# Patient Record
Sex: Male | Born: 1945
Health system: Southern US, Community
[De-identification: ages and names within clinical notes are randomized; demographics above are authoritative.]

## PROBLEM LIST (undated history)

## (undated) DIAGNOSIS — E119 Type 2 diabetes mellitus without complications: Secondary | ICD-10-CM

## (undated) DIAGNOSIS — F4310 Post-traumatic stress disorder, unspecified: Secondary | ICD-10-CM

## (undated) DIAGNOSIS — F0390 Unspecified dementia without behavioral disturbance: Secondary | ICD-10-CM

## (undated) DIAGNOSIS — R42 Dizziness and giddiness: Secondary | ICD-10-CM

## (undated) DIAGNOSIS — T1490XA Injury, unspecified, initial encounter: Secondary | ICD-10-CM

## (undated) DIAGNOSIS — I10 Essential (primary) hypertension: Secondary | ICD-10-CM

## (undated) DIAGNOSIS — M359 Systemic involvement of connective tissue, unspecified: Secondary | ICD-10-CM

## (undated) DIAGNOSIS — I2510 Atherosclerotic heart disease of native coronary artery without angina pectoris: Secondary | ICD-10-CM

## (undated) DIAGNOSIS — I509 Heart failure, unspecified: Secondary | ICD-10-CM

## (undated) DIAGNOSIS — R55 Syncope and collapse: Secondary | ICD-10-CM

## (undated) DIAGNOSIS — J45909 Unspecified asthma, uncomplicated: Secondary | ICD-10-CM

## (undated) DIAGNOSIS — G459 Transient cerebral ischemic attack, unspecified: Secondary | ICD-10-CM

## (undated) HISTORY — PX: CARDIAC SURGERY: SHX584

## (undated) HISTORY — PX: TOTAL SHOULDER REPLACEMENT: SUR1217

## (undated) HISTORY — DX: Transient cerebral ischemic attack, unspecified: G45.9

## (undated) HISTORY — DX: Injury, unspecified, initial encounter: T14.90XA

---

## 2000-11-04 ENCOUNTER — Inpatient Hospital Stay (HOSPITAL_COMMUNITY): Admission: AD | Admit: 2000-11-04 | Discharge: 2000-11-05 | Payer: Self-pay | Admitting: *Deleted

## 2002-10-04 ENCOUNTER — Inpatient Hospital Stay (HOSPITAL_COMMUNITY): Admission: EM | Admit: 2002-10-04 | Discharge: 2002-10-13 | Payer: Self-pay | Admitting: Cardiology

## 2002-10-06 ENCOUNTER — Encounter: Payer: Self-pay | Admitting: Cardiothoracic Surgery

## 2002-10-07 ENCOUNTER — Encounter: Payer: Self-pay | Admitting: Cardiothoracic Surgery

## 2002-10-08 ENCOUNTER — Encounter: Payer: Self-pay | Admitting: Cardiothoracic Surgery

## 2002-10-09 ENCOUNTER — Encounter: Payer: Self-pay | Admitting: Cardiothoracic Surgery

## 2002-10-10 ENCOUNTER — Encounter: Payer: Self-pay | Admitting: Cardiothoracic Surgery

## 2002-11-05 ENCOUNTER — Encounter: Admission: RE | Admit: 2002-11-05 | Discharge: 2002-11-05 | Payer: Self-pay | Admitting: Cardiothoracic Surgery

## 2002-11-05 ENCOUNTER — Encounter: Payer: Self-pay | Admitting: Cardiothoracic Surgery

## 2003-03-05 HISTORY — PX: EXPLORATION POST OPERATIVE OPEN HEART: SHX5061

## 2014-07-22 ENCOUNTER — Encounter (HOSPITAL_COMMUNITY): Payer: Self-pay | Admitting: Family Medicine

## 2014-07-22 ENCOUNTER — Emergency Department (HOSPITAL_COMMUNITY): Payer: Medicare HMO

## 2014-07-22 ENCOUNTER — Inpatient Hospital Stay (HOSPITAL_COMMUNITY)
Admission: EM | Admit: 2014-07-22 | Discharge: 2014-07-27 | DRG: 084 | Disposition: A | Discharge status: Skilled Nursing Facility | Payer: Medicare HMO | Attending: Internal Medicine | Admitting: Internal Medicine

## 2014-07-22 DIAGNOSIS — G319 Degenerative disease of nervous system, unspecified: Secondary | ICD-10-CM | POA: Diagnosis present

## 2014-07-22 DIAGNOSIS — I959 Hypotension, unspecified: Secondary | ICD-10-CM | POA: Diagnosis not present

## 2014-07-22 DIAGNOSIS — Z1881 Retained glass fragments: Secondary | ICD-10-CM

## 2014-07-22 DIAGNOSIS — S065X9A Traumatic subdural hemorrhage with loss of consciousness of unspecified duration, initial encounter: Secondary | ICD-10-CM | POA: Diagnosis present

## 2014-07-22 DIAGNOSIS — E876 Hypokalemia: Secondary | ICD-10-CM | POA: Diagnosis not present

## 2014-07-22 DIAGNOSIS — I25119 Atherosclerotic heart disease of native coronary artery with unspecified angina pectoris: Secondary | ICD-10-CM | POA: Diagnosis not present

## 2014-07-22 DIAGNOSIS — S80212A Abrasion, left knee, initial encounter: Secondary | ICD-10-CM | POA: Diagnosis not present

## 2014-07-22 DIAGNOSIS — I252 Old myocardial infarction: Secondary | ICD-10-CM | POA: Diagnosis not present

## 2014-07-22 DIAGNOSIS — I10 Essential (primary) hypertension: Secondary | ICD-10-CM | POA: Diagnosis not present

## 2014-07-22 DIAGNOSIS — T1490 Injury, unspecified: Secondary | ICD-10-CM | POA: Diagnosis present

## 2014-07-22 DIAGNOSIS — S0031XA Abrasion of nose, initial encounter: Secondary | ICD-10-CM | POA: Diagnosis present

## 2014-07-22 DIAGNOSIS — I493 Ventricular premature depolarization: Secondary | ICD-10-CM | POA: Diagnosis not present

## 2014-07-22 DIAGNOSIS — S40811A Abrasion of right upper arm, initial encounter: Secondary | ICD-10-CM | POA: Diagnosis present

## 2014-07-22 DIAGNOSIS — E785 Hyperlipidemia, unspecified: Secondary | ICD-10-CM | POA: Diagnosis present

## 2014-07-22 DIAGNOSIS — Z79891 Long term (current) use of opiate analgesic: Secondary | ICD-10-CM | POA: Diagnosis not present

## 2014-07-22 DIAGNOSIS — Z794 Long term (current) use of insulin: Secondary | ICD-10-CM | POA: Diagnosis not present

## 2014-07-22 DIAGNOSIS — F4310 Post-traumatic stress disorder, unspecified: Secondary | ICD-10-CM | POA: Diagnosis present

## 2014-07-22 DIAGNOSIS — Z79899 Other long term (current) drug therapy: Secondary | ICD-10-CM | POA: Diagnosis not present

## 2014-07-22 DIAGNOSIS — E780 Pure hypercholesterolemia: Secondary | ICD-10-CM | POA: Diagnosis present

## 2014-07-22 DIAGNOSIS — S065XAA Traumatic subdural hemorrhage with loss of consciousness status unknown, initial encounter: Secondary | ICD-10-CM

## 2014-07-22 DIAGNOSIS — I509 Heart failure, unspecified: Secondary | ICD-10-CM | POA: Diagnosis present

## 2014-07-22 DIAGNOSIS — Z96612 Presence of left artificial shoulder joint: Secondary | ICD-10-CM | POA: Diagnosis present

## 2014-07-22 DIAGNOSIS — S20319A Abrasion of unspecified front wall of thorax, initial encounter: Secondary | ICD-10-CM | POA: Diagnosis not present

## 2014-07-22 DIAGNOSIS — Z951 Presence of aortocoronary bypass graft: Secondary | ICD-10-CM

## 2014-07-22 DIAGNOSIS — E1165 Type 2 diabetes mellitus with hyperglycemia: Secondary | ICD-10-CM | POA: Diagnosis not present

## 2014-07-22 DIAGNOSIS — Z23 Encounter for immunization: Secondary | ICD-10-CM | POA: Diagnosis not present

## 2014-07-22 DIAGNOSIS — F0390 Unspecified dementia without behavioral disturbance: Secondary | ICD-10-CM | POA: Diagnosis present

## 2014-07-22 DIAGNOSIS — I6200 Nontraumatic subdural hemorrhage, unspecified: Secondary | ICD-10-CM | POA: Diagnosis not present

## 2014-07-22 DIAGNOSIS — T1490XA Injury, unspecified, initial encounter: Secondary | ICD-10-CM

## 2014-07-22 DIAGNOSIS — S0001XA Abrasion of scalp, initial encounter: Secondary | ICD-10-CM | POA: Diagnosis present

## 2014-07-22 DIAGNOSIS — S40812A Abrasion of left upper arm, initial encounter: Secondary | ICD-10-CM | POA: Diagnosis present

## 2014-07-22 DIAGNOSIS — J45909 Unspecified asthma, uncomplicated: Secondary | ICD-10-CM | POA: Diagnosis present

## 2014-07-22 HISTORY — DX: Atherosclerotic heart disease of native coronary artery without angina pectoris: I25.10

## 2014-07-22 HISTORY — DX: Unspecified asthma, uncomplicated: J45.909

## 2014-07-22 HISTORY — DX: Essential (primary) hypertension: I10

## 2014-07-22 HISTORY — DX: Traumatic subdural hemorrhage with loss of consciousness status unknown, initial encounter: S06.5XAA

## 2014-07-22 HISTORY — DX: Unspecified dementia, unspecified severity, without behavioral disturbance, psychotic disturbance, mood disturbance, and anxiety: F03.90

## 2014-07-22 HISTORY — DX: Heart failure, unspecified: I50.9

## 2014-07-22 HISTORY — DX: Systemic involvement of connective tissue, unspecified: M35.9

## 2014-07-22 HISTORY — DX: Syncope and collapse: R55

## 2014-07-22 HISTORY — DX: Post-traumatic stress disorder, unspecified: F43.10

## 2014-07-22 HISTORY — DX: Type 2 diabetes mellitus without complications: E11.9

## 2014-07-22 HISTORY — DX: Dizziness and giddiness: R42

## 2014-07-22 LAB — URINALYSIS, ROUTINE W REFLEX MICROSCOPIC
Glucose, UA: 500 mg/dL — AB
Ketones, ur: >80 mg/dL — AB
LEUKOCYTES UA: NEGATIVE
NITRITE: NEGATIVE
PROTEIN: NEGATIVE mg/dL
Specific Gravity, Urine: 1.02 (ref 1.005–1.030)
UROBILINOGEN UA: 0.2 mg/dL (ref 0.0–1.0)
pH: 5.5 (ref 5.0–8.0)

## 2014-07-22 LAB — COMPREHENSIVE METABOLIC PANEL
ALT: 21 U/L (ref 17–63)
AST: 33 U/L (ref 15–41)
Albumin: 4.3 g/dL (ref 3.5–5.0)
Alkaline Phosphatase: 79 U/L (ref 38–126)
Anion gap: 15 (ref 5–15)
BILIRUBIN TOTAL: 1.3 mg/dL — AB (ref 0.3–1.2)
BUN: 19 mg/dL (ref 6–20)
CALCIUM: 9.4 mg/dL (ref 8.9–10.3)
CO2: 20 mmol/L — AB (ref 22–32)
CREATININE: 1.16 mg/dL (ref 0.61–1.24)
Chloride: 103 mmol/L (ref 101–111)
GFR calc Af Amer: >60 mL/min (ref >60)
GLUCOSE: 285 mg/dL — AB (ref 65–99)
Potassium: 4.3 mmol/L (ref 3.5–5.1)
Sodium: 138 mmol/L (ref 135–145)
Total Protein: 7.8 g/dL (ref 6.5–8.1)

## 2014-07-22 LAB — URINE MICROSCOPIC-ADD ON

## 2014-07-22 LAB — I-STAT TROPONIN, ED: Troponin i, poc: 0.02 ng/mL (ref 0.00–0.08)

## 2014-07-22 LAB — VITAMIN B12: VITAMIN B 12: 344 pg/mL (ref 180–914)

## 2014-07-22 LAB — I-STAT CG4 LACTIC ACID, ED: Lactic Acid, Venous: 1.41 mmol/L (ref 0.5–2.0)

## 2014-07-22 LAB — CBG MONITORING, ED: GLUCOSE-CAPILLARY: 233 mg/dL — AB (ref 65–99)

## 2014-07-22 LAB — PROTIME-INR
INR: 1.14 (ref 0.00–1.49)
Prothrombin Time: 14.8 seconds (ref 11.6–15.2)

## 2014-07-22 LAB — CBC
HEMATOCRIT: 45.8 % (ref 39.0–52.0)
Hemoglobin: 16.2 g/dL (ref 13.0–17.0)
MCH: 33.2 pg (ref 26.0–34.0)
MCHC: 35.4 g/dL (ref 30.0–36.0)
MCV: 93.9 fL (ref 78.0–100.0)
PLATELETS: 316 10*3/uL (ref 150–400)
RBC: 4.88 MIL/uL (ref 4.22–5.81)
RDW: 12.7 % (ref 11.5–15.5)
WBC: 14.1 10*3/uL — ABNORMAL HIGH (ref 4.0–10.5)

## 2014-07-22 LAB — FOLATE: Folate: 28 ng/mL (ref >5.9)

## 2014-07-22 LAB — TROPONIN I: Troponin I: 0.03 ng/mL (ref <0.031)

## 2014-07-22 LAB — ETHANOL: Alcohol, Ethyl (B): <5 mg/dL (ref <5)

## 2014-07-22 MED ORDER — BENAZEPRIL HCL 40 MG PO TABS
40.0000 mg | ORAL_TABLET | Freq: Every day | ORAL | Status: DC
  Administered 2014-07-23: 40 mg via ORAL
  Filled 2014-07-22 (×2): qty 1

## 2014-07-22 MED ORDER — TERAZOSIN HCL 2 MG PO CAPS
2.0000 mg | ORAL_CAPSULE | Freq: Every day | ORAL | Status: DC
  Administered 2014-07-23 – 2014-07-26 (×4): 2 mg via ORAL
  Filled 2014-07-22 (×7): qty 1

## 2014-07-22 MED ORDER — METOPROLOL TARTRATE 25 MG PO TABS
25.0000 mg | ORAL_TABLET | Freq: Two times a day (BID) | ORAL | Status: DC
  Administered 2014-07-23 – 2014-07-24 (×3): 25 mg via ORAL
  Filled 2014-07-22 (×5): qty 1

## 2014-07-22 MED ORDER — FAMOTIDINE IN NACL 20-0.9 MG/50ML-% IV SOLN
20.0000 mg | Freq: Two times a day (BID) | INTRAVENOUS | Status: DC
  Administered 2014-07-23 – 2014-07-24 (×4): 20 mg via INTRAVENOUS
  Filled 2014-07-22 (×5): qty 50

## 2014-07-22 MED ORDER — ACETAMINOPHEN 325 MG PO TABS
650.0000 mg | ORAL_TABLET | ORAL | Status: DC | PRN
  Administered 2014-07-23 – 2014-07-24 (×3): 650 mg via ORAL
  Filled 2014-07-22 (×3): qty 2

## 2014-07-22 MED ORDER — FINASTERIDE 5 MG PO TABS
5.0000 mg | ORAL_TABLET | Freq: Every day | ORAL | Status: DC
  Administered 2014-07-23 – 2014-07-27 (×6): 5 mg via ORAL
  Filled 2014-07-22 (×6): qty 1

## 2014-07-22 MED ORDER — LIDOCAINE HCL (PF) 1 % IJ SOLN
INTRAMUSCULAR | Status: AC
  Filled 2014-07-22: qty 5

## 2014-07-22 MED ORDER — CARVEDILOL 6.25 MG PO TABS: 6.2500 mg | ORAL_TABLET | Freq: Two times a day (BID) | ORAL | Status: DC

## 2014-07-22 MED ORDER — PROMETHAZINE HCL 25 MG PO TABS: 25.0000 mg | ORAL_TABLET | Freq: Once | ORAL | Status: DC

## 2014-07-22 MED ORDER — ONDANSETRON HCL 4 MG/2ML IJ SOLN
4.0000 mg | Freq: Four times a day (QID) | INTRAMUSCULAR | Status: DC | PRN
  Administered 2014-07-25: 4 mg via INTRAVENOUS
  Filled 2014-07-22: qty 2

## 2014-07-22 MED ORDER — IOHEXOL 300 MG/ML  SOLN
100.0000 mL | Freq: Once | INTRAMUSCULAR | Status: AC | PRN
  Administered 2014-07-22: 100 mL via INTRAVENOUS

## 2014-07-22 MED ORDER — INSULIN ASPART 100 UNIT/ML ~~LOC~~ SOLN
2.0000 [IU] | SUBCUTANEOUS | Status: DC
  Administered 2014-07-23: 6 [IU] via SUBCUTANEOUS
  Administered 2014-07-23: 4 [IU] via SUBCUTANEOUS
  Administered 2014-07-23: 6 [IU] via SUBCUTANEOUS
  Administered 2014-07-23: 2 [IU] via SUBCUTANEOUS

## 2014-07-22 MED ORDER — ONDANSETRON HCL 4 MG/2ML IJ SOLN
4.0000 mg | Freq: Once | INTRAMUSCULAR | Status: AC
  Administered 2014-07-22: 4 mg via INTRAVENOUS
  Filled 2014-07-22: qty 2

## 2014-07-22 MED ORDER — ATORVASTATIN CALCIUM 40 MG PO TABS
40.0000 mg | ORAL_TABLET | Freq: Every day | ORAL | Status: DC
  Administered 2014-07-23 – 2014-07-26 (×4): 40 mg via ORAL
  Filled 2014-07-22 (×5): qty 1

## 2014-07-22 MED ORDER — ATORVASTATIN CALCIUM 80 MG PO TABS: 80.0000 mg | ORAL_TABLET | Freq: Every day | ORAL | Status: DC

## 2014-07-22 MED ORDER — PROMETHAZINE HCL 25 MG/ML IJ SOLN
12.5000 mg | Freq: Once | INTRAMUSCULAR | Status: AC
  Administered 2014-07-22: 12.5 mg via INTRAVENOUS
  Filled 2014-07-22: qty 1

## 2014-07-22 MED ORDER — ALBUTEROL SULFATE (2.5 MG/3ML) 0.083% IN NEBU: 2.5000 mg | INHALATION_SOLUTION | RESPIRATORY_TRACT | Status: DC | PRN

## 2014-07-22 MED ORDER — SODIUM CHLORIDE 0.9 % IV SOLN: 250.0000 mL | INTRAVENOUS | Status: DC | PRN

## 2014-07-22 MED ORDER — TETANUS-DIPHTH-ACELL PERTUSSIS 5-2.5-18.5 LF-MCG/0.5 IM SUSP
0.5000 mL | Freq: Once | INTRAMUSCULAR | Status: AC
  Administered 2014-07-22: 0.5 mL via INTRAMUSCULAR
  Filled 2014-07-22: qty 0.5
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
## 2014-07-22 NOTE — ED Notes (Signed)
Paged Dr. Jordan LikesPool to [phone]

## 2014-07-22 NOTE — ED Notes (Signed)
Pt reports wiping himself down with bleach today.
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
## 2014-07-22 NOTE — ED Notes (Signed)
Critical care MD at bedside 
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
## 2014-07-22 NOTE — ED Notes (Signed)
Ok to remove c-collar and sit pt up in bed per Dr Jeraldine LootsLockwood.

## 2014-07-22 NOTE — ED Notes (Addendum)
Harwani, MD at bedside. 
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
## 2014-07-22 NOTE — ED Notes (Signed)
Douglas Flores(son): [phone].

## 2014-07-23 ENCOUNTER — Inpatient Hospital Stay (HOSPITAL_COMMUNITY): Payer: Medicare HMO

## 2014-07-23 DIAGNOSIS — T1490XA Injury, unspecified, initial encounter: Secondary | ICD-10-CM | POA: Insufficient documentation

## 2014-07-23 DIAGNOSIS — I6200 Nontraumatic subdural hemorrhage, unspecified: Secondary | ICD-10-CM

## 2014-07-23 DIAGNOSIS — T1490 Injury, unspecified: Secondary | ICD-10-CM

## 2014-07-23 LAB — RPR: RPR: NONREACTIVE

## 2014-07-23 LAB — CBC
HCT: 40.8 % (ref 39.0–52.0)
Hemoglobin: 14 g/dL (ref 13.0–17.0)
MCH: 32.6 pg (ref 26.0–34.0)
MCHC: 34.3 g/dL (ref 30.0–36.0)
MCV: 94.9 fL (ref 78.0–100.0)
Platelets: 289 10*3/uL (ref 150–400)
RBC: 4.3 MIL/uL (ref 4.22–5.81)
RDW: 12.9 % (ref 11.5–15.5)
WBC: 13.2 10*3/uL — ABNORMAL HIGH (ref 4.0–10.5)

## 2014-07-23 LAB — LIPID PANEL
Cholesterol: 194 mg/dL (ref 0–200)
HDL: 36 mg/dL — AB (ref >40)
LDL CALC: 123 mg/dL — AB (ref 0–99)
Total CHOL/HDL Ratio: 5.4 RATIO
Triglycerides: 177 mg/dL — ABNORMAL HIGH (ref <150)
VLDL: 35 mg/dL (ref 0–40)

## 2014-07-23 LAB — MAGNESIUM: MAGNESIUM: 1.8 mg/dL (ref 1.7–2.4)

## 2014-07-23 LAB — GLUCOSE, CAPILLARY
GLUCOSE-CAPILLARY: 175 mg/dL — AB (ref 65–99)
GLUCOSE-CAPILLARY: 202 mg/dL — AB (ref 65–99)
GLUCOSE-CAPILLARY: 266 mg/dL — AB (ref 65–99)
GLUCOSE-CAPILLARY: 291 mg/dL — AB (ref 65–99)
GLUCOSE-CAPILLARY: 296 mg/dL — AB (ref 65–99)
Glucose-Capillary: 141 mg/dL — ABNORMAL HIGH (ref 65–99)
Glucose-Capillary: 285 mg/dL — ABNORMAL HIGH (ref 65–99)

## 2014-07-23 LAB — BASIC METABOLIC PANEL
Anion gap: 11 (ref 5–15)
BUN: 28 mg/dL — ABNORMAL HIGH (ref 6–20)
CHLORIDE: 105 mmol/L (ref 101–111)
CO2: 24 mmol/L (ref 22–32)
Calcium: 8.6 mg/dL — ABNORMAL LOW (ref 8.9–10.3)
Creatinine, Ser: 1.76 mg/dL — ABNORMAL HIGH (ref 0.61–1.24)
GFR calc non Af Amer: 38 mL/min — ABNORMAL LOW (ref >60)
GFR, EST AFRICAN AMERICAN: 44 mL/min — AB (ref >60)
Glucose, Bld: 223 mg/dL — ABNORMAL HIGH (ref 65–99)
POTASSIUM: 4.2 mmol/L (ref 3.5–5.1)
Sodium: 140 mmol/L (ref 135–145)

## 2014-07-23 LAB — TROPONIN I
TROPONIN I: 0.05 ng/mL — AB (ref <0.031)
Troponin I: 0.05 ng/mL — ABNORMAL HIGH (ref <0.031)
Troponin I: 0.04 ng/mL — ABNORMAL HIGH (ref <0.031)

## 2014-07-23 LAB — PHOSPHORUS: Phosphorus: 4.8 mg/dL — ABNORMAL HIGH (ref 2.5–4.6)

## 2014-07-23 LAB — HIV ANTIBODY (ROUTINE TESTING W REFLEX): HIV Screen 4th Generation wRfx: NONREACTIVE

## 2014-07-23 LAB — TSH: TSH: 0.861 u[IU]/mL (ref 0.350–4.500)

## 2014-07-23 LAB — MRSA PCR SCREENING: MRSA BY PCR: NEGATIVE

## 2014-07-23 LAB — CLOSTRIDIUM DIFFICILE BY PCR: Toxigenic C. Difficile by PCR: NEGATIVE

## 2014-07-23 MED ORDER — SODIUM CHLORIDE 0.9 % IV SOLN
INTRAVENOUS | Status: DC
  Administered 2014-07-23: 2.9 [IU]/h via INTRAVENOUS
  Filled 2014-07-23: qty 2.5

## 2014-07-23 MED ORDER — INSULIN GLARGINE 100 UNIT/ML ~~LOC~~ SOLN
10.0000 [IU] | Freq: Two times a day (BID) | SUBCUTANEOUS | Status: DC
  Administered 2014-07-23: 10 [IU] via SUBCUTANEOUS
  Filled 2014-07-23 (×2): qty 0.1

## 2014-07-23 MED ORDER — SODIUM CHLORIDE 0.9 % IV BOLUS (SEPSIS)
1000.0000 mL | Freq: Once | INTRAVENOUS | Status: AC
  Administered 2014-07-23: 1000 mL via INTRAVENOUS

## 2014-07-23 MED ORDER — PERFLUTREN LIPID MICROSPHERE
1.0000 mL | INTRAVENOUS | Status: AC | PRN
  Administered 2014-07-23: 6 mL via INTRAVENOUS
  Filled 2014-07-23: qty 10

## 2014-07-23 MED ORDER — CETYLPYRIDINIUM CHLORIDE 0.05 % MT LIQD
7.0000 mL | Freq: Two times a day (BID) | OROMUCOSAL | Status: DC
  Administered 2014-07-23 – 2014-07-24 (×3): 7 mL via OROMUCOSAL

## 2014-07-23 MED ORDER — MAGNESIUM SULFATE 50 % IJ SOLN: 2.0000 g | Freq: Once | INTRAVENOUS | Status: DC

## 2014-07-23 MED ORDER — MAGNESIUM SULFATE 2 GM/50ML IV SOLN
2.0000 g | Freq: Once | INTRAVENOUS | Status: AC
  Administered 2014-07-23: 2 g via INTRAVENOUS
  Filled 2014-07-23: qty 50

## 2014-07-23 MED ORDER — CHLORHEXIDINE GLUCONATE 0.12 % MT SOLN
15.0000 mL | Freq: Two times a day (BID) | OROMUCOSAL | Status: DC
  Administered 2014-07-23 – 2014-07-24 (×4): 15 mL via OROMUCOSAL
  Filled 2014-07-23 (×4): qty 15

## 2014-07-23 NOTE — Progress Notes (Addendum)
eLink Physician-Brief Progress Note Patient Name: Adaline SillLarry Tetro DOB: 05/10/1945 MRN: [id number]   Date of Service  07/23/2014  HPI/Events of Note  Hypotension. BP = 77/51. Patient received Metoprolol, Hytrin and Benazepril all at the same time.   eICU Interventions  Will cautiously fluid challenge. Nurse instructed not to give all three medications at the same time again.      Intervention Category Intermediate Interventions: Hypotension - evaluation and management  Cameron Schwinn Eugene 07/23/2014, 2:14 AM
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
## 2014-07-23 NOTE — Progress Notes (Signed)
  Echocardiogram 2D Echocardiogram has been performed.  Janalyn HarderWest, Karas Pickerill R 07/23/2014, 10:28 AM
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
## 2014-07-23 NOTE — Progress Notes (Signed)
eLink Physician-Brief Progress Note Patient Name: Douglas SillLarry Doell DOB: 06-05-1945 MRN: [id number]   Date of Service  07/23/2014  HPI/Events of Note  Hypotension with BP = 74/46.  eICU Interventions  Will bolus with 0.9 NaCl 1 liter IV over 1 hour now.      Intervention Category Intermediate Interventions: Hypotension - evaluation and management  Sommer,Steven Eugene 07/23/2014, 6:08 AM

## 2014-07-23 NOTE — Progress Notes (Signed)
Pressure drsg to right wrist secured with elastic band. Drsg removed 2nd to edematous hand. Puncture site noted with stitch in place. No active bleeding noted, site redressed with gauze and paper tape.  Radial pulse easily palpable.
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
## 2014-07-23 NOTE — Progress Notes (Signed)
Called Neuro Sx due to pt's confusion.   MD stated to continue to monitor pt, and no additional orders were given. I will continue to monitor pt.
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
## 2014-07-23 NOTE — Progress Notes (Signed)
eLink Physician-Brief Progress Note Patient Name: Douglas Flores DOB: [birth year] MRN: [id number]   Date of Service  07/23/2014  HPI/Events of Note  Elevated blood sugar.  Patient eating and on SSI only.  At home on 70/30 and metformin.  eICU Interventions  lantus 10u bid SQ and change SSI to sensitive     Intervention Category Intermediate Interventions: Hyperglycemia - evaluation and treatment  Henry RusselSMITH, [name], P 07/23/2014, 4:44 PM

## 2014-07-23 NOTE — Progress Notes (Signed)
Daily meds given as ordered. BP at this time 160/60. 2 hours later BP 70s/50s. Dr. Arsenio LoaderSommer notified. Bolus ordered and given. Meds not to be given together.
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
## 2014-07-24 LAB — GLUCOSE, CAPILLARY
GLUCOSE-CAPILLARY: 133 mg/dL — AB (ref 65–99)
GLUCOSE-CAPILLARY: 180 mg/dL — AB (ref 65–99)
GLUCOSE-CAPILLARY: 189 mg/dL — AB (ref 65–99)
GLUCOSE-CAPILLARY: 213 mg/dL — AB (ref 65–99)
GLUCOSE-CAPILLARY: 216 mg/dL — AB (ref 65–99)
GLUCOSE-CAPILLARY: 241 mg/dL — AB (ref 65–99)
GLUCOSE-CAPILLARY: 346 mg/dL — AB (ref 65–99)
GLUCOSE-CAPILLARY: 97 mg/dL (ref 65–99)
Glucose-Capillary: 106 mg/dL — ABNORMAL HIGH (ref 65–99)
Glucose-Capillary: 179 mg/dL — ABNORMAL HIGH (ref 65–99)
Glucose-Capillary: 275 mg/dL — ABNORMAL HIGH (ref 65–99)
Glucose-Capillary: 368 mg/dL — ABNORMAL HIGH (ref 65–99)
Glucose-Capillary: 85 mg/dL (ref 65–99)
Glucose-Capillary: 182 mg/dL — ABNORMAL HIGH (ref 65–99)

## 2014-07-24 LAB — BASIC METABOLIC PANEL
Anion gap: 7 (ref 5–15)
BUN: 10 mg/dL (ref 6–20)
CO2: 28 mmol/L (ref 22–32)
Calcium: 8.5 mg/dL — ABNORMAL LOW (ref 8.9–10.3)
Chloride: 107 mmol/L (ref 101–111)
Creatinine, Ser: 0.84 mg/dL (ref 0.61–1.24)
GFR calc Af Amer: >60 mL/min (ref >60)
GLUCOSE: 114 mg/dL — AB (ref 65–99)
POTASSIUM: 3.6 mmol/L (ref 3.5–5.1)
Sodium: 142 mmol/L (ref 135–145)

## 2014-07-24 LAB — MAGNESIUM: MAGNESIUM: 1.8 mg/dL (ref 1.7–2.4)

## 2014-07-24 LAB — PHOSPHORUS: Phosphorus: 2.7 mg/dL (ref 2.5–4.6)

## 2014-07-24 MED ORDER — POTASSIUM CHLORIDE 10 MEQ/100ML IV SOLN
10.0000 meq | INTRAVENOUS | Status: AC
  Administered 2014-07-24 (×2): 10 meq via INTRAVENOUS
  Filled 2014-07-24 (×2): qty 100

## 2014-07-24 MED ORDER — INSULIN ASPART 100 UNIT/ML ~~LOC~~ SOLN
0.0000 [IU] | Freq: Three times a day (TID) | SUBCUTANEOUS | Status: DC
  Administered 2014-07-24 (×2): 3 [IU] via SUBCUTANEOUS
  Administered 2014-07-24: 5 [IU] via SUBCUTANEOUS
  Administered 2014-07-25: 2 [IU] via SUBCUTANEOUS
  Administered 2014-07-25: 8 [IU] via SUBCUTANEOUS
  Administered 2014-07-25: 2 [IU] via SUBCUTANEOUS
  Administered 2014-07-26: 8 [IU] via SUBCUTANEOUS
  Administered 2014-07-26 (×2): 3 [IU] via SUBCUTANEOUS

## 2014-07-24 MED ORDER — INSULIN ASPART 100 UNIT/ML ~~LOC~~ SOLN
0.0000 [IU] | Freq: Every day | SUBCUTANEOUS | Status: DC
  Administered 2014-07-24: 2 [IU] via SUBCUTANEOUS

## 2014-07-24 MED ORDER — MAGNESIUM SULFATE 2 GM/50ML IV SOLN
2.0000 g | Freq: Once | INTRAVENOUS | Status: AC
  Administered 2014-07-24: 2 g via INTRAVENOUS
  Filled 2014-07-24: qty 50

## 2014-07-24 MED ORDER — INSULIN GLARGINE 100 UNIT/ML ~~LOC~~ SOLN
15.0000 [IU] | Freq: Two times a day (BID) | SUBCUTANEOUS | Status: DC
  Administered 2014-07-24 – 2014-07-27 (×7): 15 [IU] via SUBCUTANEOUS
  Filled 2014-07-24 (×8): qty 0.15

## 2014-07-24 MED ORDER — FAMOTIDINE 20 MG PO TABS
20.0000 mg | ORAL_TABLET | Freq: Two times a day (BID) | ORAL | Status: DC
  Administered 2014-07-24 – 2014-07-25 (×2): 20 mg via ORAL
  Filled 2014-07-24 (×3): qty 1

## 2014-07-24 MED ORDER — METOPROLOL TARTRATE 50 MG PO TABS
50.0000 mg | ORAL_TABLET | Freq: Two times a day (BID) | ORAL | Status: DC
  Administered 2014-07-24 – 2014-07-25 (×2): 50 mg via ORAL
  Filled 2014-07-24 (×3): qty 1

## 2014-07-24 MED ORDER — NITROGLYCERIN 0.4 MG SL SUBL
SUBLINGUAL_TABLET | SUBLINGUAL | Status: AC
  Administered 2014-07-24: 0.4 mg
  Filled 2014-07-24: qty 1

## 2014-07-24 MED ORDER — POTASSIUM CHLORIDE 10 MEQ/100ML IV SOLN: 10.0000 meq | INTRAVENOUS | Status: DC

## 2014-07-24 NOTE — Progress Notes (Signed)
eLink Physician-Brief Progress Note Patient Name: Adaline SillLarry Groleau DOB: 10/09/1945 MRN: [id number]   Date of Service  07/24/2014  HPI/Events of Note  Hyperglycemia, went back on insulin gtt despite glargine  eICU Interventions  Increase glargine bid dose D/c ICU hyperglycemia Add SSI     Intervention Category Intermediate Interventions: Hyperglycemia - evaluation and treatment  MCQUAID, DOUGLAS 07/24/2014, 4:24 AM

## 2014-07-24 NOTE — Progress Notes (Signed)
eLink Physician-Brief Progress Note Patient Name: Douglas SillLarry Flores DOB: 02/22/1946 MRN: [id number]   Date of Service  07/24/2014  HPI/Events of Note  K low, mg low, uop low  eICU Interventions  Replete K,Mg, low dose both     Intervention Category Intermediate Interventions: Electrolyte abnormality - evaluation and management  MCQUAID, DOUGLAS 07/24/2014, 6:21 AM
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
## 2014-07-24 NOTE — Progress Notes (Signed)
Overall stable. Patient denies headache or other problem beyond his sternal discomfort.  Afebrile. Blood pressure a little less labile. No periods arrhythmia or bradycardia. Awake and alert. Oriented and appropriate. Cranial nerve function intact. Chronic right and some [place]. Motor 5/5 bilaterally. No pronator drift.  Overall stable from my standpoint. Will repeat head CT scan tomorrow. I am hopeful that his subdural hematoma can be managed without surgical intervention.
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
## 2014-07-25 ENCOUNTER — Encounter (HOSPITAL_COMMUNITY): Payer: Self-pay | Admitting: Radiology

## 2014-07-25 ENCOUNTER — Inpatient Hospital Stay (HOSPITAL_COMMUNITY): Payer: Medicare HMO

## 2014-07-25 DIAGNOSIS — E1165 Type 2 diabetes mellitus with hyperglycemia: Secondary | ICD-10-CM

## 2014-07-25 DIAGNOSIS — R55 Syncope and collapse: Secondary | ICD-10-CM

## 2014-07-25 LAB — GLUCOSE, CAPILLARY
GLUCOSE-CAPILLARY: 131 mg/dL — AB (ref 65–99)
Glucose-Capillary: 267 mg/dL — ABNORMAL HIGH (ref 65–99)
Glucose-Capillary: 133 mg/dL — ABNORMAL HIGH (ref 65–99)
Glucose-Capillary: 162 mg/dL — ABNORMAL HIGH (ref 65–99)

## 2014-07-25 LAB — HEMOGLOBIN A1C
Hgb A1c MFr Bld: 7.8 % — ABNORMAL HIGH (ref 4.8–5.6)
MEAN PLASMA GLUCOSE: 177 mg/dL

## 2014-07-25 MED ORDER — FAMOTIDINE 20 MG PO TABS
40.0000 mg | ORAL_TABLET | Freq: Every day | ORAL | Status: DC
  Administered 2014-07-26: 40 mg via ORAL
  Filled 2014-07-25: qty 2

## 2014-07-25 MED ORDER — TUBERCULIN PPD 5 UNIT/0.1ML ID SOLN
5.0000 [IU] | Freq: Once | INTRADERMAL | Status: AC
  Administered 2014-07-25: 5 [IU] via INTRADERMAL
  Filled 2014-07-25: qty 0.1

## 2014-07-25 MED ORDER — METOPROLOL TARTRATE 50 MG PO TABS
50.0000 mg | ORAL_TABLET | Freq: Three times a day (TID) | ORAL | Status: DC
  Administered 2014-07-25 – 2014-07-27 (×6): 50 mg via ORAL
  Filled 2014-07-25 (×6): qty 1

## 2014-07-25 NOTE — Progress Notes (Signed)
Overall stable. No headache. No events overnight.  Awake and alert. Oriented and appropriate. Motor and sensory function intact. Cranial nerve function intact with stable right slight pupillary enlargement.  Follow-up head CT scan stable with minimal left convexity subdural hematoma and no significant mass effect.  Overall doing well from my standpoint. Minimal risk of enlargement later of the small subdural fluid collection. Hopefully this will resolve on its own. Patient may be mobilized without restrictions from my standpoint. Okay to leave ICU.
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
## 2014-07-25 NOTE — Clinical Social Work Note (Signed)
CSW spoke with patient and his son about SNF options and patient's son stated he would [place] [facility] [place] [facility] [place] [facility] [place].  CSW's prepared FL2 and [place] per family and patient's permission.  Ervin KnackEric R. Yarisa Lynam, MSW, Theresia MajorsLCSWA [phone] 07/25/2014 5:02 PM
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
## 2014-07-25 NOTE — Clinical Social Work Note (Signed)
Clinical Social Worker received referral for possible ST-SNF placement.  Chart reviewed.  PT/OT orders are not yet placed.  Spoke with RN who states that patient was independent prior to admission.  RN Case Manager who will follow up with patient to discuss home health needs if deemed appropriate.    CSW signing off - please re consult if social work needs arise.  Macario GoldsJesse Kortny Lirette, Kentucky[place]CSW [phone]

## 2014-07-25 NOTE — Progress Notes (Addendum)
Inpatient Diabetes Program Recommendations  AACE/ADA: New Consensus Statement on Inpatient Glycemic Control (2013)  Target Ranges:  Prepandial:   less than 140 mg/dL      Peak postprandial:   less than 180 mg/dL (1-2 hours)      Critically ill patients:  140 - 180 mg/dL   Reason for Assessment: Results for Douglas Flores, Douglas Flores (MRN [id number]) as of 07/25/2014 12:33  Ref. Range 07/24/2014 11:47 07/24/2014 16:04 07/24/2014 21:19 07/25/2014 07:48 07/25/2014 11:47  Glucose-Capillary Latest Ref Range: 65-99 mg/dL 409241 (H) 811189 (H) 914216 (H) 131 (H) 267 (H)    Diabetes history: Type 2 diabetes Outpatient Diabetes medications: Novolin 70/30 20 units bid Current orders for Inpatient glycemic control:  Lantus 15 units bid, Novolog moderate tid with meals and HS  Consider adding Novolog meal coverage  4 units tid with meals (Hold if patient eats less than 50%). Also please consider checking A1C to determine pre-hospitalization glycemic control.  Thanks, Beryl MeagerJenny Lai Hendriks, RN, BC-ADM Inpatient Diabetes Coordinator Pager [phone] (8a-5p)
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
## 2014-07-26 LAB — GLUCOSE, CAPILLARY
GLUCOSE-CAPILLARY: 253 mg/dL — AB (ref 65–99)
GLUCOSE-CAPILLARY: 180 mg/dL — AB (ref 65–99)
GLUCOSE-CAPILLARY: 180 mg/dL — AB (ref 65–99)
Glucose-Capillary: 128 mg/dL — ABNORMAL HIGH (ref 65–99)

## 2014-07-26 MED ORDER — HYDROCODONE-ACETAMINOPHEN 5-325 MG PO TABS: 1.0000 | ORAL_TABLET | Freq: Four times a day (QID) | ORAL | Status: DC | PRN

## 2014-07-26 MED ORDER — METOPROLOL TARTRATE 50 MG PO TABS: 50.0000 mg | ORAL_TABLET | Freq: Three times a day (TID) | ORAL | Status: DC

## 2014-07-26 NOTE — Progress Notes (Signed)
[facility] Patient IV's dc'd

## 2014-07-26 NOTE — Progress Notes (Signed)
Patient's son arrived to floor and stated that [facility] contacted him and would have a bed available for his father if he discharges tomorrow morning.  This is the facility they prefer.  Notified night time coverage, Craige CottaKirby, who gave orders for the patient to discharge tomorrow morning.  Called [facility] and notified Pam that patient will not be coming tonight.  Patient's son aware of the situation and will be able to transport patient to [facility] tomorrow.
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
## 2014-07-26 NOTE — Clinical Social Work Note (Signed)
Clinical Social Work Assessment  Patient Details  Name: Douglas Flores MRN: [id number] Date of Birth: 01/21/1946  Date of referral:  07/25/14               Reason for consult:  Facility Placement, Discharge Planning                Permission sought to share information with:  Family Supports Permission granted to share information::  Yes, Verbal Permission Granted  Name::     Anthony Eslick  Relationship::  Son  Contact Information:  [phone]  Housing/Transportation Living arrangements for the past 2 months:  Single Family Home Source of Information:  Patient, Adult Children Patient Interpreter Needed:  None Criminal Activity/Legal Involvement Pertinent to Current Situation/Hospitalization:  No - Comment as needed Significant Relationships:  Adult Children Lives with:  Self Do you feel safe going back to the place where you live?  No Need for family participation in patient care:  Yes (Comment)  Care giving concerns:  Patient son at bedside and states that patient is no longer able to live alone due to frequent falls.  Patient son states that patient is on a waiting list at [facility], however patient son now feels that patient would be more appropriate for ALF vs. SNF placement.   Social Worker assessment / plan:  Clinical Social Worker met with patient and patient son at bedside to offer support and discuss patient needs at discharge.  Patient son states that patient is on the waiting list at [facility] in [place] and that they do have an assisted living level of care if appropriate for patient at discharge.  CSW notified facility of patient potential needs and they are willing to assess pending therapy evaluations.  Patient and patient son also verbalize agreement for SNF if needed, however did not verify county of interest.  CSW to await PT/OT recommendations to determine most appropriate venue at discharge.  Employment status:  Retired Insurance  information:  Managed Medicare PT Recommendations:  Skilled Nursing Facility Information / Referral to community resources:  Skilled Nursing Facility  Patient/Family's Response to care:  Patient son verbalizes his appreciation for CSW support and concern regarding patient discharge planning needs.  Patient son states that he feels at ease knowing that patient will not return home at discharge.  Patient son states that patient does have [employer] Medicare and he will bring the cards with policy numbers on the next visit to the hospital.  Patient family agreeable with discharge planning needs at this time.  Patient/Family's Understanding of and Emotional Response to Diagnosis, Current Treatment, and Prognosis:  Patient and patient son able to recognize that patient is no longer safely allowed to live at home alone due to frequent falls.  Patient and patient son realistic about patient long term needs for placement and state that financially patient will be able to afford assisted living placement.    Emotional Assessment Appearance:  Appears older than stated age Attitude/Demeanor/Rapport:  Inconsistent Affect (typically observed):  Accepting, Calm, Appropriate Orientation:  Oriented to Self, Oriented to Place, Oriented to  Time, Oriented to Situation Alcohol / Substance use:  Not Applicable Psych involvement (Current and /or in the community):  No (Comment)  Discharge Needs  Concerns to be addressed:  Care Coordination, Discharge Planning Concerns Readmission within the last 30 days:  No Current discharge risk:  Dependent with Mobility, Lives alone Barriers to Discharge:  Continued Medical Work up  Jesse , LCSW [phone]  

## 2014-07-26 NOTE — Clinical Social Work Note (Addendum)
Patient to be d/c'ed tomorrow to [facility] [place].  Patient and family agreeable to plans will transport via patient's son's car RN to call report.  Ervin KnackEric R. Azalya Galyon, MSW, LCSWA [phone] 07/27/2014 11:30 AM

## 2014-07-26 NOTE — Progress Notes (Signed)
Overall stable. No new issues or problems. Patient may be discharged from my standpoint.  He should not be placed on antiplatelets therapy or other anticoagulation for at least 2 weeks.

## 2014-07-26 NOTE — Discharge Summary (Signed)
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 07/23/14 0820  TROPONINI 0.03 0.05*  0.05* 0.04*    CBG:  Recent Labs Lab 07/25/14 1147 07/25/14 1703 07/25/14 2122 07/26/14 0941 07/26/14 1107  GLUCAP 267* 133* 162* 253* 180*       Signed:  Mlissa Tamayo  [facility] 07/26/2014, 11:55 AM

## 2014-07-26 NOTE — Discharge Instructions (Signed)
Follow with Pcp Not In System in 5-7 days ° °Please get a complete blood count and chemistry panel checked by your Primary MD at your next visit, and again as instructed by your Primary MD. Please get your medications reviewed and adjusted by your Primary MD. ° °Please request your Primary MD to go over all Hospital Tests and Procedure/Radiological results at the follow up, please get all Hospital records sent to your Prim MD by signing hospital release before you go home. ° °If you had Pneumonia of Lung problems at the Hospital: °Please get a 2 view Chest X ray done in 6-8 weeks after hospital discharge or sooner if instructed by your Primary MD. ° °If you have Congestive Heart Failure: °Please call your Cardiologist or Primary MD anytime you have any of the following symptoms:  °1) 3 pound weight gain in 24 hours or 5 pounds in 1 week  °2) shortness of breath, with or without a dry hacking cough  °3) swelling in the hands, feet or stomach  °4) if you have to sleep on extra pillows at night in order to breathe ° °Follow cardiac low salt diet and 1.5 lit/day fluid restriction. ° °If you have diabetes °Accuchecks 4 times/day, Once in AM empty stomach and then before each meal. °Log in all results and show them to your primary doctor at your next visit. °If any glucose reading is under 80 or above 300 call your primary MD immediately. ° °If you have Seizure/Convulsions/Epilepsy: °Please do not drive, operate heavy machinery, participate in activities at heights or participate in high speed sports until you have seen by Primary MD or a Neurologist and advised to do so again. ° °If you had Gastrointestinal Bleeding: °Please ask your Primary MD to check a complete blood count within one week of discharge or at your next visit. Your endoscopic/colonoscopic biopsies that are pending at the time of discharge, will also need to followed by your Primary MD. ° °Get Medicines reviewed and adjusted. °Please take all your  medications with you for your next visit with your Primary MD ° °Please request your Primary MD to go over all hospital tests and procedure/radiological results at the follow up, please ask your Primary MD to get all Hospital records sent to his/her office. ° °If you experience worsening of your admission symptoms, develop shortness of breath, life threatening emergency, suicidal or homicidal thoughts you must seek medical attention immediately by calling 911 or calling your MD immediately  if symptoms less severe. ° °You must read complete instructions/literature along with all the possible adverse reactions/side effects for all the Medicines you take and that have been prescribed to you. Take any new Medicines after you have completely understood and accpet all the possible adverse reactions/side effects.  ° °Do not drive or operate heavy machinery when taking Pain medications.  ° °Do not take more than prescribed Pain, Sleep and Anxiety Medications ° °Special Instructions: If you have smoked or chewed Tobacco  in the last 2 yrs please stop smoking, stop any regular Alcohol  and or any Recreational drug use. ° °Wear Seat belts while driving. ° °Please note °You were cared for by a hospitalist during your hospital stay. If you have any questions about your discharge medications or the care you received while you were in the hospital after you are discharged, you can call the unit and asked to speak with the hospitalist on call if the hospitalist that took care of you is not available.   Once you are discharged, your primary care physician will handle any further medical issues. Please note that NO REFILLS for any discharge medications will be authorized once you are discharged, as it is imperative that you return to your primary care physician (or establish a relationship with a primary care physician if you do not have one) for your aftercare needs so that they can reassess your need for medications and monitor your  lab values. ° °You can reach the hospitalist office at phone [phone] or fax [phone] °  °If you do not have a primary care physician, you can call [phone] for a physician referral. ° °Activity: As tolerated with Full fall precautions use walker/cane & assistance as needed ° °Diet: heart healthy ° °Disposition Home ° ° °

## 2014-07-27 LAB — VITAMIN B1: Vitamin B1 (Thiamine): 118.4 nmol/L (ref 66.5–200.0)

## 2014-07-27 LAB — GLUCOSE, CAPILLARY: Glucose-Capillary: 103 mg/dL — ABNORMAL HIGH (ref 65–99)

## 2014-07-27 NOTE — Progress Notes (Signed)
Discharge to [facility]. Rep[ort given to HickoryKasey, Charity fundraiserN. Patient is alert and oriented, not in any distress prior to discharge. Son transporting patient.

## 2014-07-27 NOTE — Clinical Social Work Note (Signed)
Patient to be d/c'ed today [place] [facility] [place].  Patient and family agreeable to plans will transport via patient's son's care RN to call report.  Windell MouldingEric Wendie Diskin, MSW, Theresia MajorsLCSWA [phone]
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
## 2016-05-07 IMAGING — CT CT HEAD W/O CM
1 series · 15 of 30 positions shown, 19 images · non-contrast
Comparison: CT head July 22, 2014 history of diabetes, hypertension,
vertigo, syncope.

CLINICAL DATA: Followup acute subdural hematoma.

EXAM:
CT HEAD WITHOUT CONTRAST
TECHNIQUE: Contiguous axial images were obtained from the base of the skull
through the vertex without intravenous contrast.

[Series 2: head 5.0 h30s · axial · 0.46mm/px · z∈[-159,-19]mm · 15 of 32 slices shown, 19 images]
[im 2/32  brain]
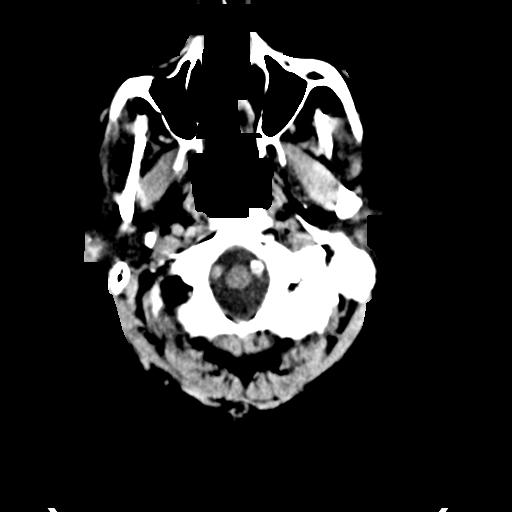
[im 2/32  bone]
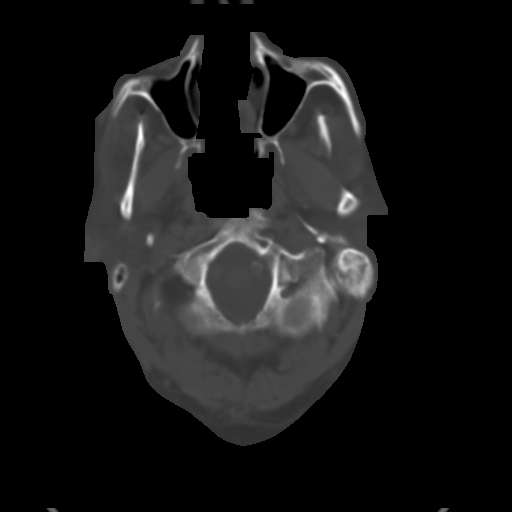
[im 4/32  brain]
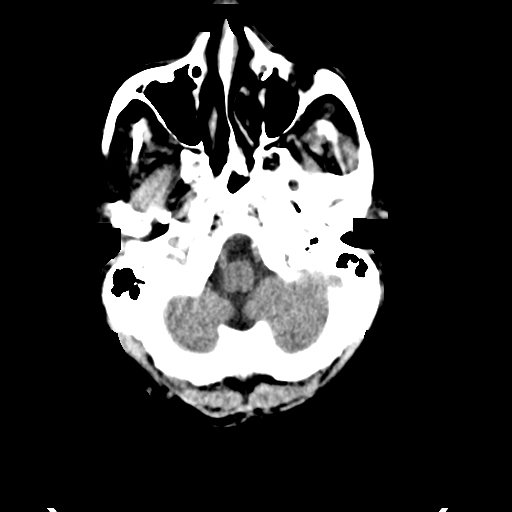
[im 6/32  brain]
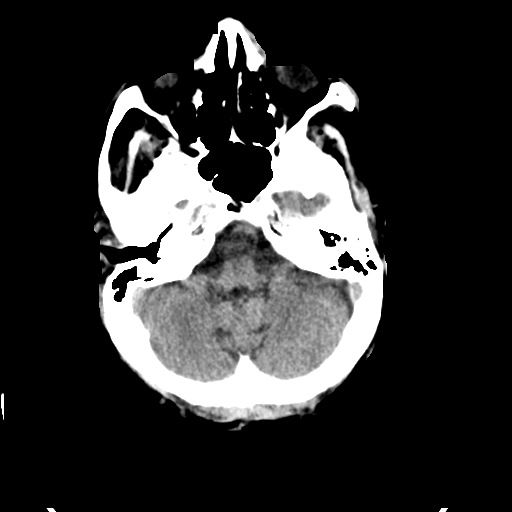
[im 8/32  brain]
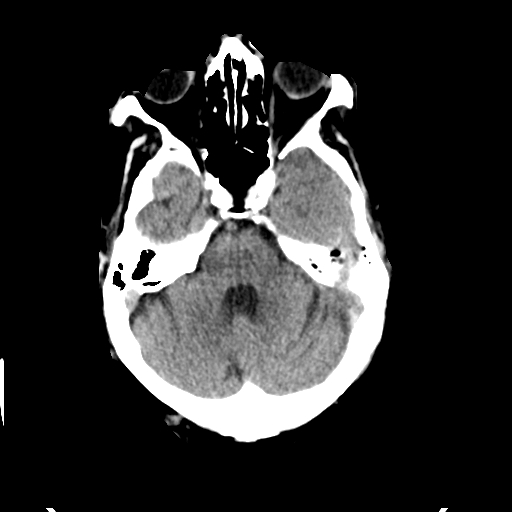
[im 10/32  brain]
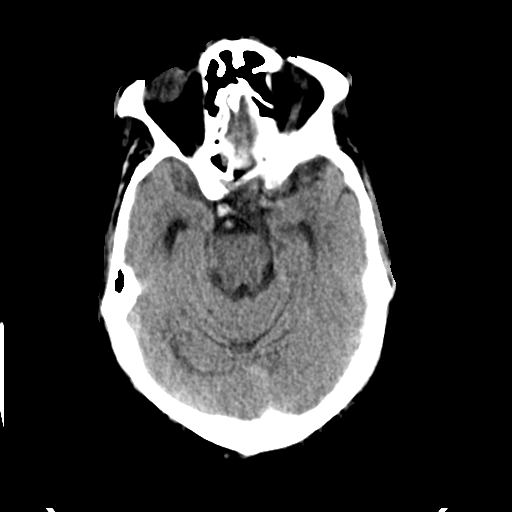
[im 10/32  bone]
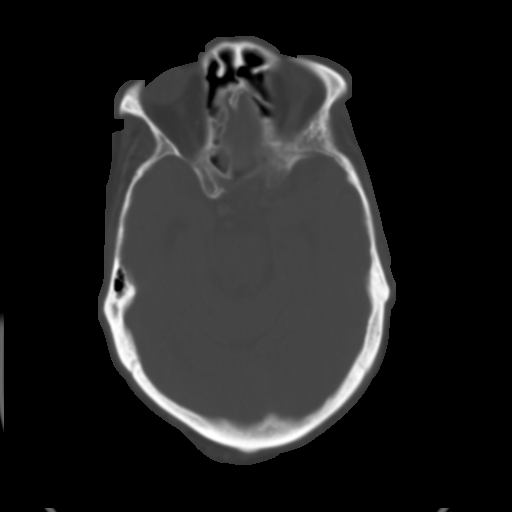
[im 12/32  brain]
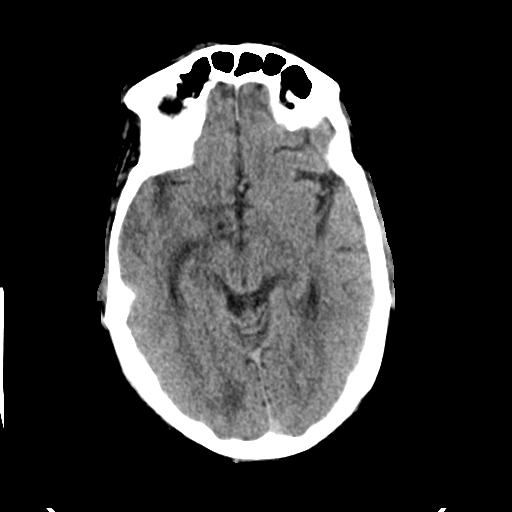
[im 14/32  brain]
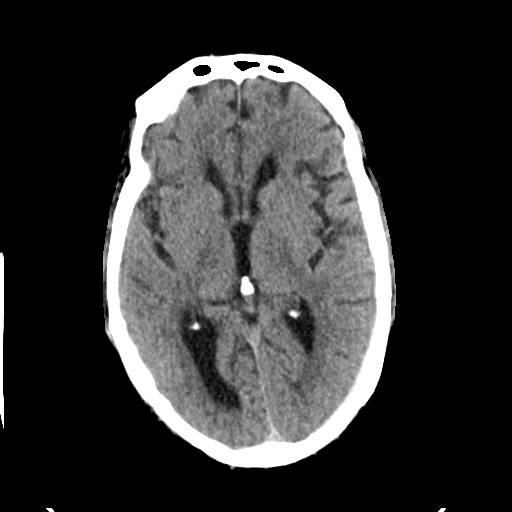
[im 17/32  brain]
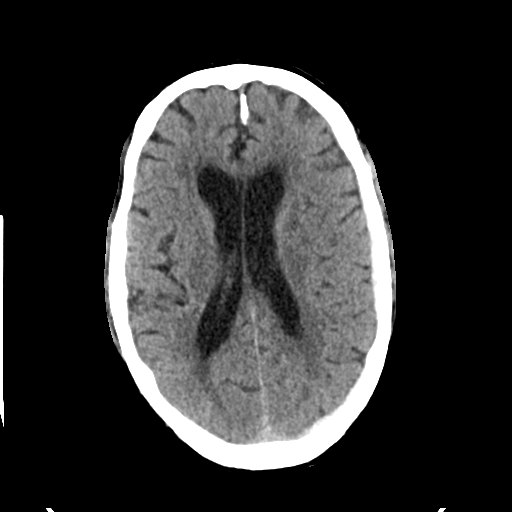
[im 18/32  brain]
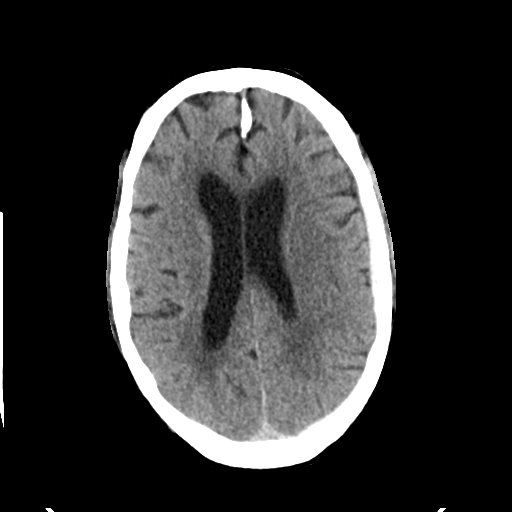
[im 18/32  bone]
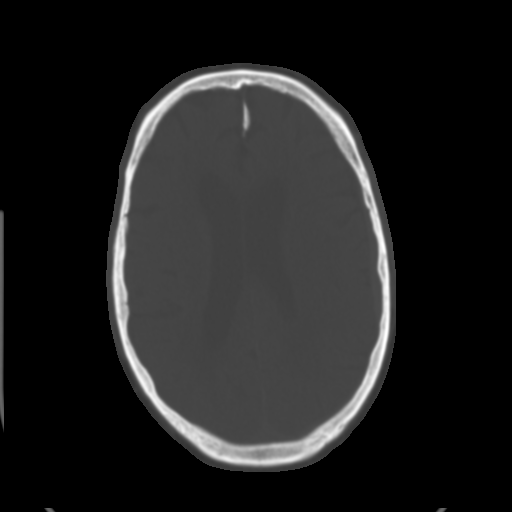
[im 20/32  brain]
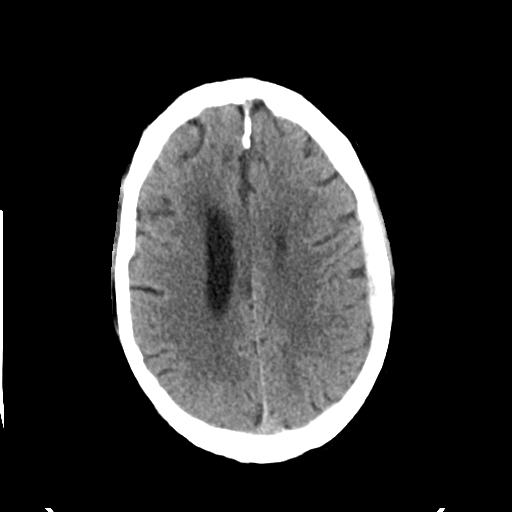
[im 22/32  brain]
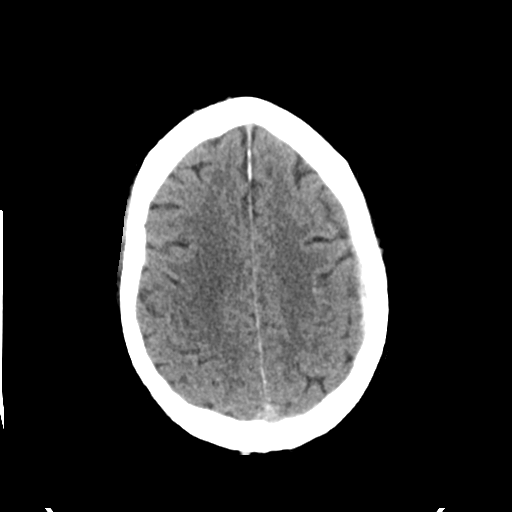
[im 24/32  brain]
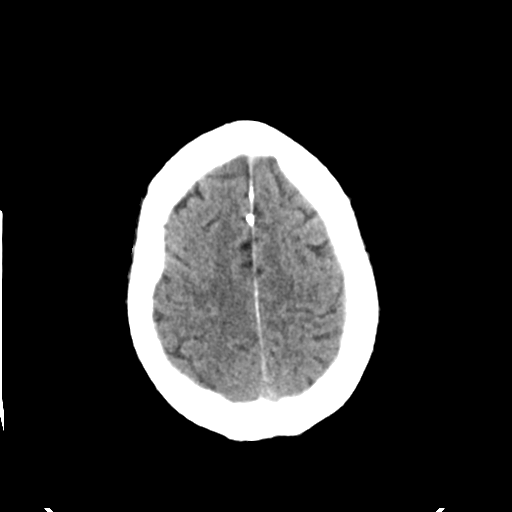
[im 26/32  brain]
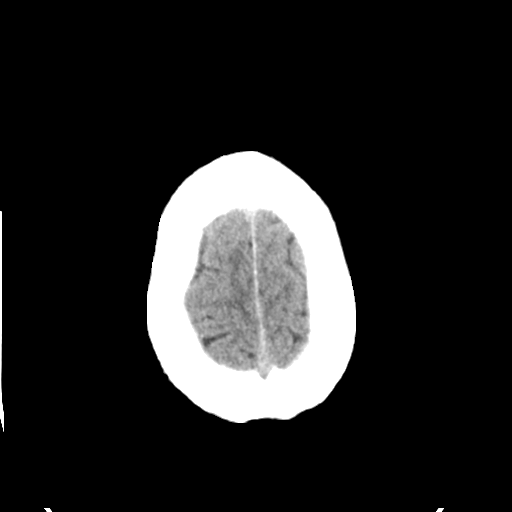
[im 26/32  bone]
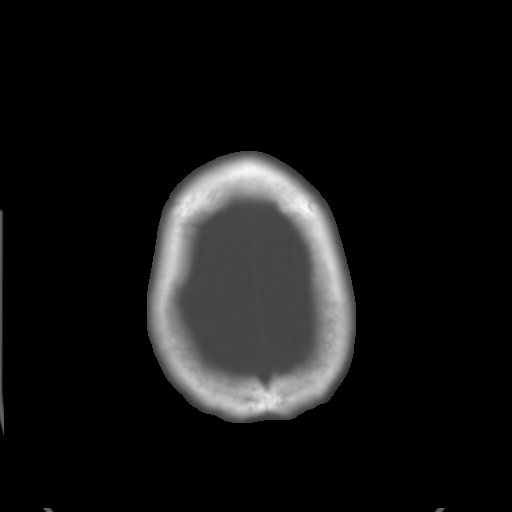
[im 28/32  brain]
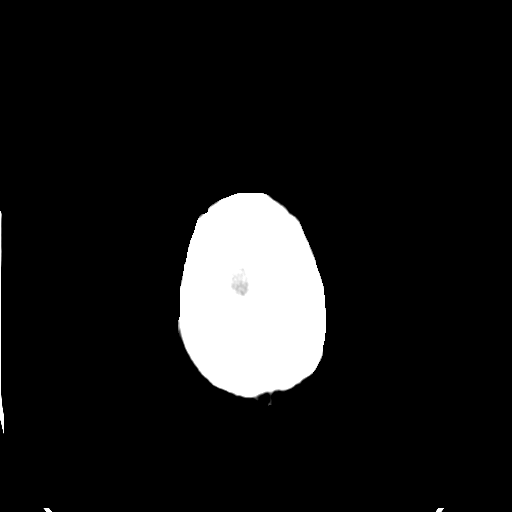
[im 30/32  brain]
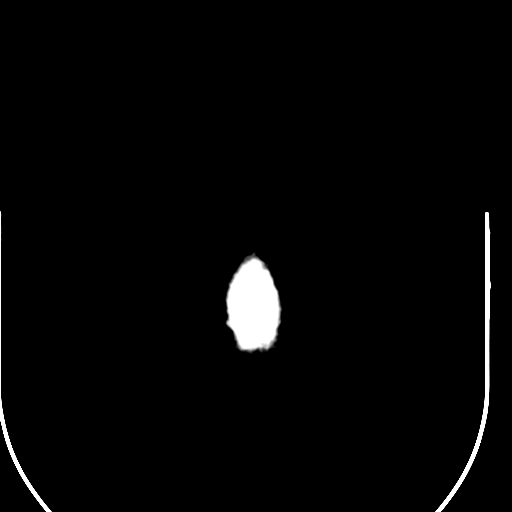

[15 of 30 positions shown; findings below may reference images not displayed]

FINDINGS: Similar LEFT holohemispheric 5 mm dense subdural hematoma resulting
in 5 mm LEFT to RIGHT midline shift, unchanged. No intraparenchymal
hemorrhage. Ventricles and sulci are somewhat prominent for age.
Patchy supratentorial white matter hypodensities are unchanged.

Basal cisterns are patent. Severe calcific atherosclerosis of the
carotid siphons. Mild paranasal sinus mucosal thickening without
air-fluid levels. The mastoid air cells are well aerated. Ocular
globes and orbital contents are unremarkable, status post LEFT
ocular lens implant. No skull fracture. Again seen is glass foreign
body within the LEFT nasal ala soft tissues.
IMPRESSION: Similar 5 mm acute holohemispheric LEFT subdural hematoma resulting
in 5 mm LEFT-to-RIGHT midline shift.

Parenchymal brain volume loss advanced for age, similar. Moderate
white matter changes likely represent chronic small vessel ischemic
disease.

By: Petrus Kappah Styn

## 2016-05-09 IMAGING — CT CT HEAD W/O CM
1 of 2 series · 15 of 30 positions shown, 19 images · non-contrast
Comparison: 07/23/2014; 07/22/2014

CLINICAL DATA: Evaluate subdural hematoma

EXAM:
CT HEAD WITHOUT CONTRAST
TECHNIQUE: Contiguous axial images were obtained from the base of the skull
through the vertex without intravenous contrast.

[Series 3: head 2.0 h70h · axial · 0.50mm/px · z∈[-54,+88]mm · 15 of 79 slices shown, 19 images]
[im 4/79  brain]
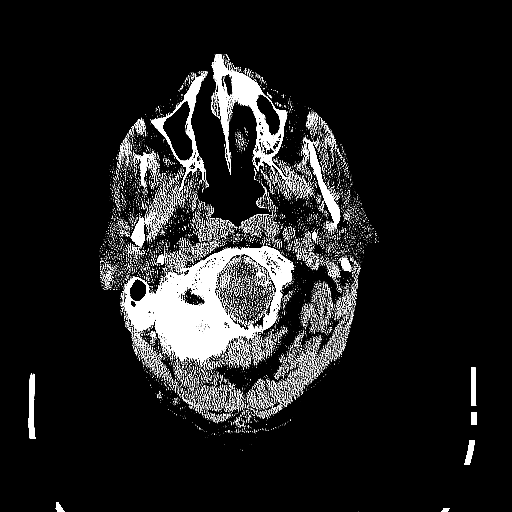
[im 4/79  bone]
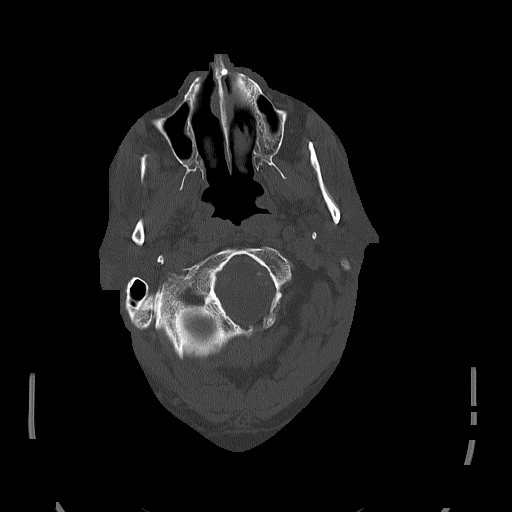
[im 8/79  brain]
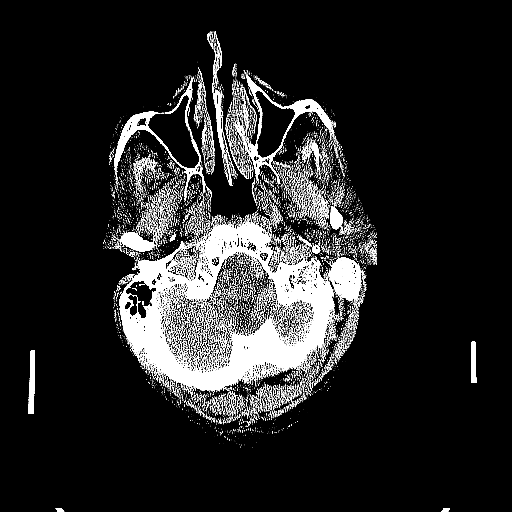
[im 16/79  brain]
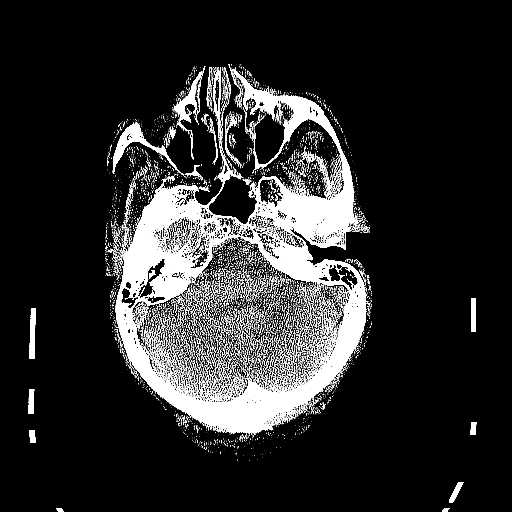
[im 20/79  brain]
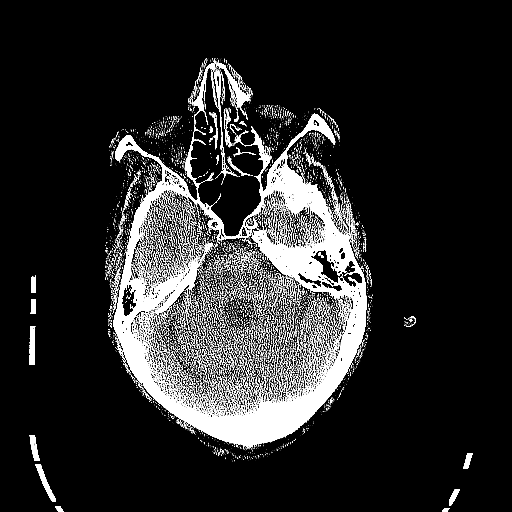
[im 24/79  brain]
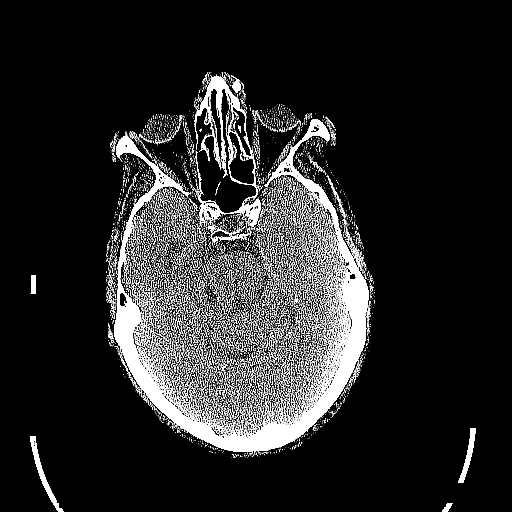
[im 24/79  bone]
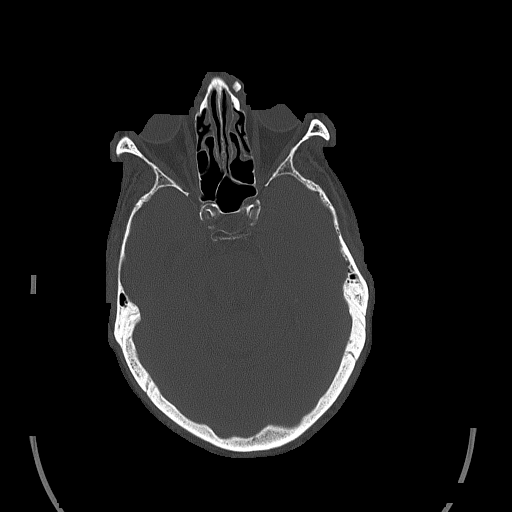
[im 28/79  brain]
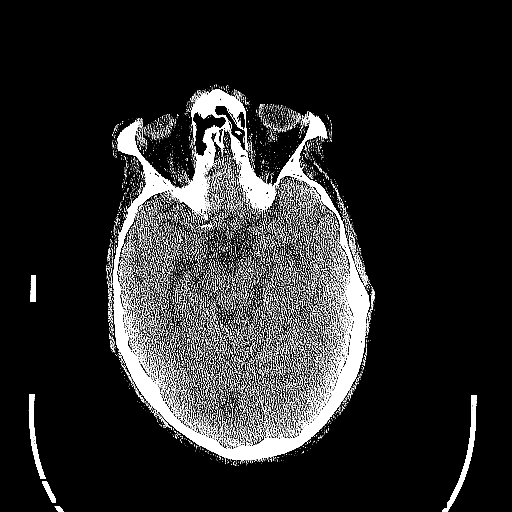
[im 36/79  brain]
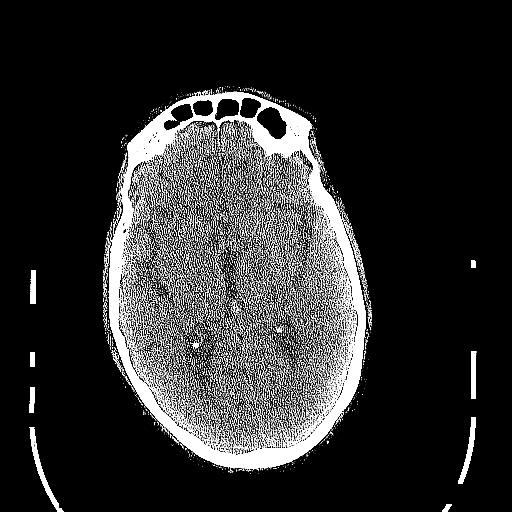
[im 40/79  brain]
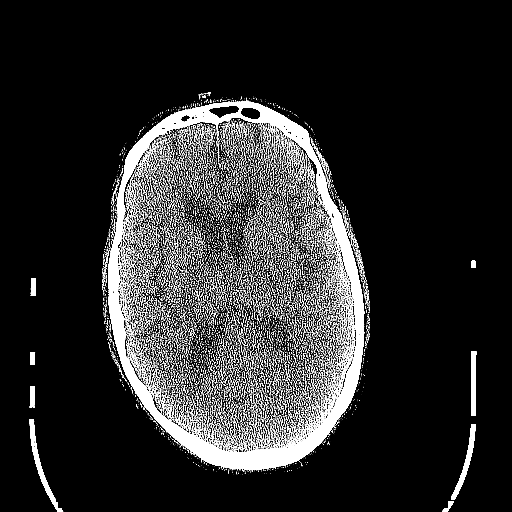
[im 43/79  brain]
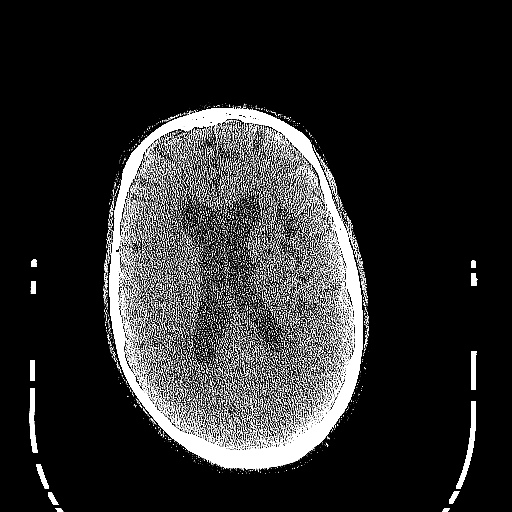
[im 43/79  bone]
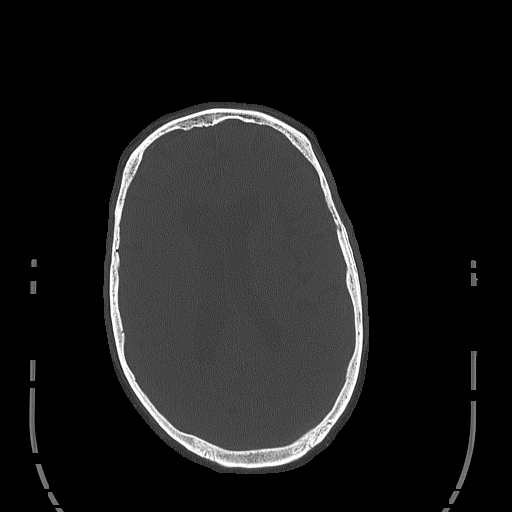
[im 51/79  brain]
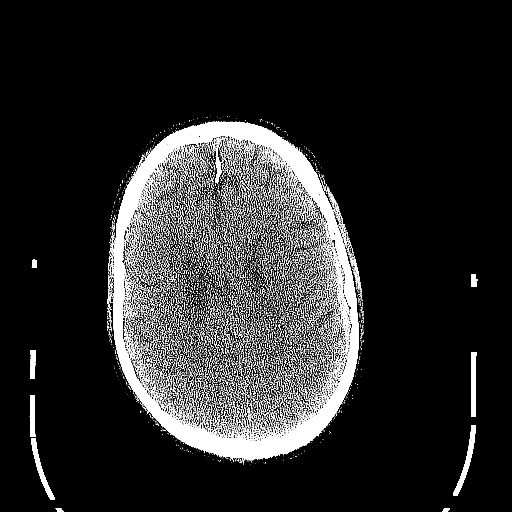
[im 55/79  brain]
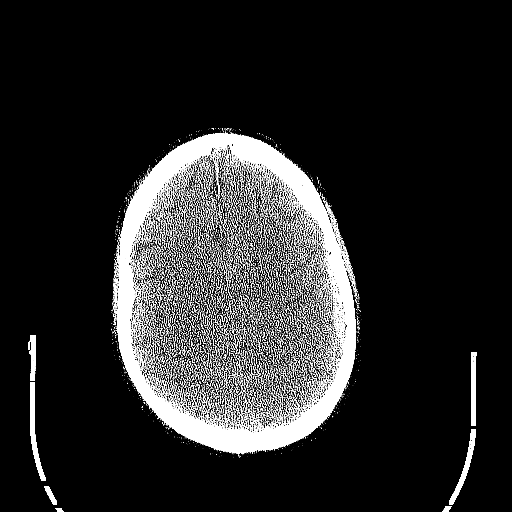
[im 59/79  brain]
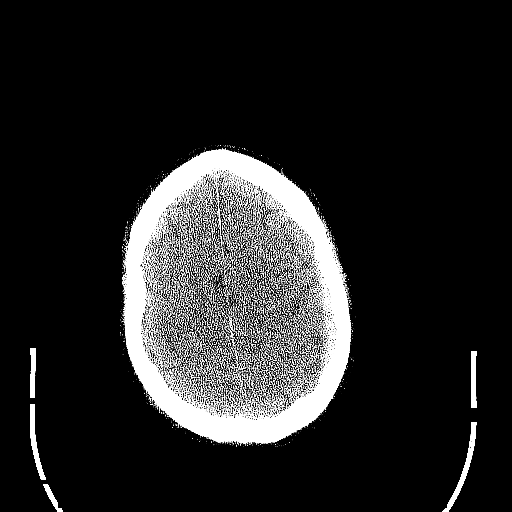
[im 63/79  brain]
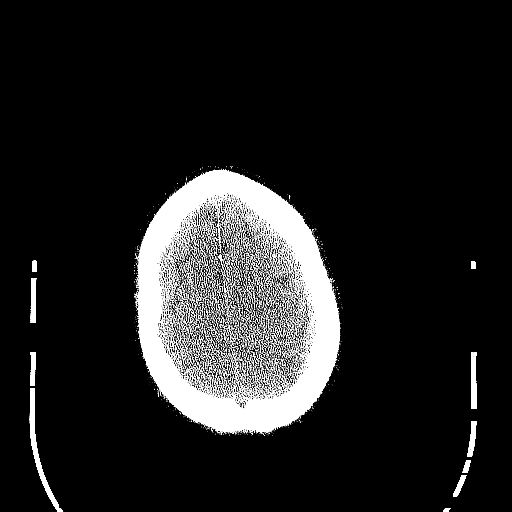
[im 63/79  bone]
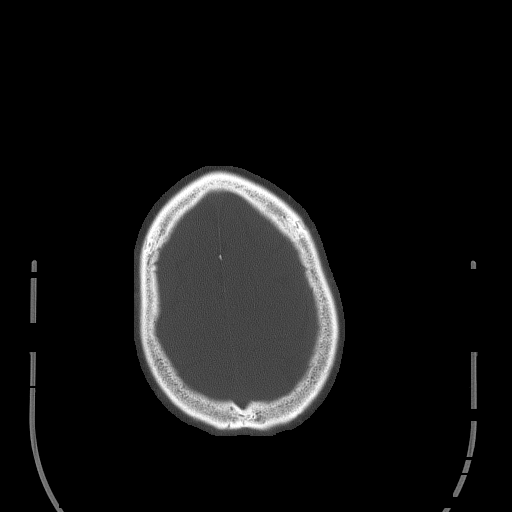
[im 71/79  brain]
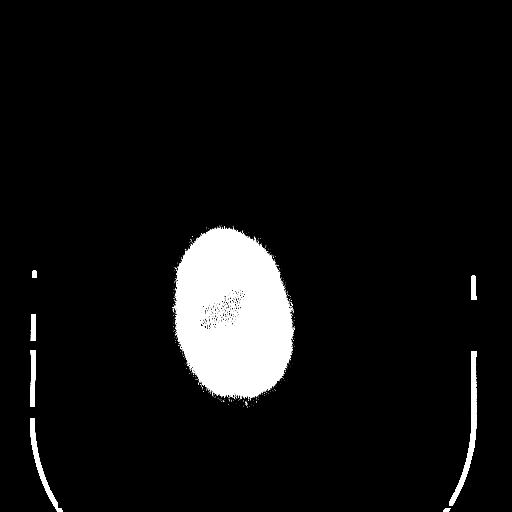
[im 75/79  brain]
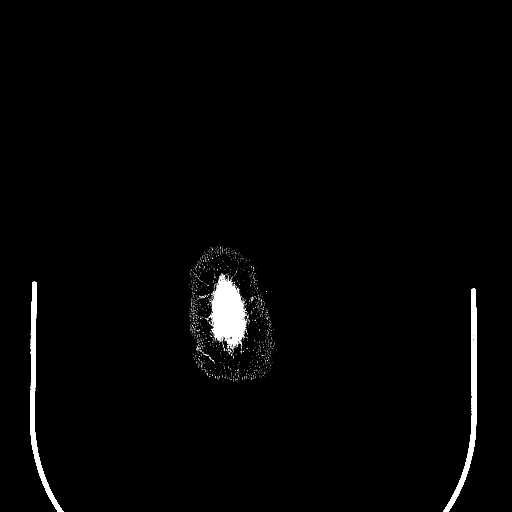

[15 of 30 positions shown; findings below may reference images not displayed]

FINDINGS: No change to minimal decrease in size of small crescentic left-sided
subdural hematoma about the left parietal convexity with dominant
component measuring approximately 0.4 cm in diameter (image 21,
series 2), previously, 5 mm. This finding is associated with minimal
residual left to right midline shift measuring approximately 4 mm
(image 18, series 2). Intracranial atherosclerosis.

Similar findings of atrophy and centralized volume loss. Scattered
periventricular hypodensities compatible with microvascular ischemic
disease. No CT evidence of superimposed acute large territory
infarct. No intraparenchymal or extra-axial mass. Unchanged size and
configuration of the ventricles and basilar cisterns. Limited
visualization of the paranasal sinuses and mastoid air cells is
normal. No air-fluid levels. Unchanged punctate (approximately
cm) calcification about the base of the left-side of the nose (image
24, series 23). No acutely displaced calvarial fracture.
IMPRESSION: No change to minimal decrease in size of small left-sided subdural
hematoma associated minimal (approximately 4 mm) of left-to-right
midline shift.

## 2016-12-31 ENCOUNTER — Ambulatory Visit (INDEPENDENT_AMBULATORY_CARE_PROVIDER_SITE_OTHER): Payer: Medicare HMO | Admitting: Podiatry

## 2016-12-31 ENCOUNTER — Encounter: Payer: Self-pay | Admitting: Podiatry

## 2016-12-31 VITALS — BP 151/72 | HR 68 | Ht 67.0 in | Wt 182.0 lb

## 2016-12-31 DIAGNOSIS — M79672 Pain in left foot: Secondary | ICD-10-CM | POA: Diagnosis not present

## 2016-12-31 DIAGNOSIS — M2040 Other hammer toe(s) (acquired), unspecified foot: Secondary | ICD-10-CM | POA: Diagnosis not present

## 2016-12-31 DIAGNOSIS — E0842 Diabetes mellitus due to underlying condition with diabetic polyneuropathy: Secondary | ICD-10-CM | POA: Diagnosis not present

## 2016-12-31 DIAGNOSIS — M79671 Pain in right foot: Secondary | ICD-10-CM

## 2016-12-31 DIAGNOSIS — B351 Tinea unguium: Secondary | ICD-10-CM | POA: Diagnosis not present

## 2016-12-31 NOTE — Progress Notes (Signed)
SUBJECTIVE: 71 y.o. year old male presents accompanied by a care taker requesting nails trimmed. Patient stated that he has an infection on 5th toe left foot and goes to wound care center.  According to accompanied care taker, the wound is managed by Wound care center and follows with regular scheduled visits. He is here to have problematic toe nails trimmed.  He is IDDM diabetic and not controlled. He is a resident at [facility].  Review of Systems  Constitutional: Negative.   HENT: Negative.   Eyes: Negative.   Respiratory: Negative.   Cardiovascular: Negative.   Gastrointestinal: Negative.   Genitourinary: Negative.   Musculoskeletal: Negative.   Skin: Negative.    OBJECTIVE: DERMATOLOGIC EXAMINATION: Nails: Thick dystrophic nails x 10. Ulcerating corn 5th toe at contact surface without active drainage. Opening is about 0.3 cm and limited to breakdown of skin. No associated cellulitis noted. No deep sinus tracts noted.  VASCULAR EXAMINATION OF LOWER LIMBS: Pedal pulses are not palpable bilateral.  No edema or erythema noted. Temperature gradient from tibial crest to dorsum of foot is within normal bilateral.  NEUROLOGIC EXAMINATION OF THE LOWER LIMBS: Failed to respond to Monofilament (Semmes-Weinstein 10-gm) sensory testing on both feet. Loss of perception on Vibratory sensations(128Hz  turning fork) at medial and lateral forefoot bilateral.  Loss of Sharp and Dull discriminatory sensations at the plantar ball bilateral.   MUSCULOSKELETAL EXAMINATION: Positive for Cavus foot with severely contracted digits 1-5 bilateral L>R. Cocked up hallux and digital ulcer on left foot.  Enlarged 5th toe left foot possible from chronic inflammation from skin lesion.  ASSESSMENT: Onychomycosis x 10. 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 Ulcerated digital corn 5th toe left, limited to breakdown of skin, under care through Wound care  center. Diabetic not controlled. Diabetic Neuropathy bilateral.  PLAN: Reviewed findings. Continue care with Wound care center on 5th toe left. All nails debrided. Return in 3 months or sooner if needed.

## 2016-12-31 NOTE — Patient Instructions (Signed)
Seen for hypertrophic nails. All nails debrided. Return in 3 months or as needed.  

## 2017-02-26 ENCOUNTER — Emergency Department (HOSPITAL_BASED_OUTPATIENT_CLINIC_OR_DEPARTMENT_OTHER)
Admission: EM | Admit: 2017-02-26 | Discharge: 2017-02-26 | Disposition: A | Discharge status: Home / Self Care | Payer: Medicare HMO | Attending: Emergency Medicine | Admitting: Emergency Medicine

## 2017-02-26 ENCOUNTER — Emergency Department (HOSPITAL_BASED_OUTPATIENT_CLINIC_OR_DEPARTMENT_OTHER): Payer: Medicare HMO

## 2017-02-26 ENCOUNTER — Encounter (HOSPITAL_BASED_OUTPATIENT_CLINIC_OR_DEPARTMENT_OTHER): Payer: Self-pay | Admitting: Emergency Medicine

## 2017-02-26 ENCOUNTER — Other Ambulatory Visit: Payer: Self-pay

## 2017-02-26 DIAGNOSIS — Z79899 Other long term (current) drug therapy: Secondary | ICD-10-CM | POA: Diagnosis not present

## 2017-02-26 DIAGNOSIS — Z96612 Presence of left artificial shoulder joint: Secondary | ICD-10-CM | POA: Insufficient documentation

## 2017-02-26 DIAGNOSIS — J209 Acute bronchitis, unspecified: Secondary | ICD-10-CM | POA: Diagnosis not present

## 2017-02-26 DIAGNOSIS — Z794 Long term (current) use of insulin: Secondary | ICD-10-CM | POA: Diagnosis not present

## 2017-02-26 DIAGNOSIS — J45909 Unspecified asthma, uncomplicated: Secondary | ICD-10-CM | POA: Diagnosis not present

## 2017-02-26 DIAGNOSIS — I2510 Atherosclerotic heart disease of native coronary artery without angina pectoris: Secondary | ICD-10-CM | POA: Insufficient documentation

## 2017-02-26 DIAGNOSIS — E119 Type 2 diabetes mellitus without complications: Secondary | ICD-10-CM | POA: Insufficient documentation

## 2017-02-26 DIAGNOSIS — J40 Bronchitis, not specified as acute or chronic: Secondary | ICD-10-CM

## 2017-02-26 DIAGNOSIS — I10 Essential (primary) hypertension: Secondary | ICD-10-CM | POA: Diagnosis not present

## 2017-02-26 DIAGNOSIS — R05 Cough: Secondary | ICD-10-CM | POA: Diagnosis present

## 2017-02-26 MED ORDER — AZITHROMYCIN 250 MG PO TABS: 250.0000 mg | ORAL_TABLET | Freq: Every day | ORAL | 0 refills | Status: DC

## 2017-02-26 MED ORDER — PREDNISONE 20 MG PO TABS: 20.0000 mg | ORAL_TABLET | Freq: Every day | ORAL | 0 refills | Status: DC

## 2017-02-26 MED ORDER — ALBUTEROL SULFATE (2.5 MG/3ML) 0.083% IN NEBU: 2.5000 mg | INHALATION_SOLUTION | RESPIRATORY_TRACT | 0 refills | Status: DC

## 2017-02-26 MED FILL — AZITHROMYCIN 250 MG TABLET: 250 | 5 days supply | Qty: 6 | Fill #0

## 2017-02-26 MED FILL — ALBUTEROL 0.083% INHAL SOLN: (2.5 MG/3ML | 13 days supply | Qty: 150 | Fill #0

## 2017-02-26 MED FILL — predniSONE 20 MG TABS: 20 | 5 days supply | Qty: 5 | Fill #0

## 2017-02-26 NOTE — ED Triage Notes (Signed)
Cough and congestion for over two weeks.  Family in triage on the phone but relates the patient has been sick for two weeks.  Pt states he is coughing up yellow phlegm.  Pt states he feels like its all in his chest.
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
## 2017-04-02 ENCOUNTER — Ambulatory Visit: Payer: Medicare HMO | Admitting: Podiatry

## 2017-05-08 ENCOUNTER — Ambulatory Visit: Payer: Medicare HMO | Admitting: Podiatry

## 2017-05-08 ENCOUNTER — Encounter: Payer: Self-pay | Admitting: Podiatry

## 2017-05-08 DIAGNOSIS — L97522 Non-pressure chronic ulcer of other part of left foot with fat layer exposed: Secondary | ICD-10-CM

## 2017-05-08 DIAGNOSIS — E0842 Diabetes mellitus due to underlying condition with diabetic polyneuropathy: Secondary | ICD-10-CM

## 2017-05-08 DIAGNOSIS — M2040 Other hammer toe(s) (acquired), unspecified foot: Secondary | ICD-10-CM | POA: Diagnosis not present

## 2017-05-08 DIAGNOSIS — B351 Tinea unguium: Secondary | ICD-10-CM

## 2017-05-08 NOTE — Progress Notes (Signed)
Subjective: 72 y.o. year old male patient presents with a care taker for ingrown nails.  Patient does not know he has open wound in left great toe. He is uncontrolled IDDM and reside at [facility].  Objective: Dermatologic: Thick yellow deformed nails x 10. Open skin ulcer distal end left great toe, 1 cm .with red dry base. Vascular: Pedal pulses are not palpable. Orthopedic: Severe cocked up hallux left great toe. 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  Assessment: Dystrophic mycotic nails x 10. Ulcer left great toe, 1 cm involving subdermal layer. Severe digital contracture. IDDM with peripheral neuropathy.  Treatment: All mycotic nails and callused wound debrided.  Buttress pad placed under left great toe. Amerigel ointment dressing applied. Written home care instruction given. Return in 1 week to replace buttress pad and debridement of the wound.

## 2017-05-08 NOTE — Patient Instructions (Signed)
Seen for hypertrophic nails x 10. Noted of open ulcer at distal end of the left great toe. All debrided. Left great toe dressed with Amergel ointment and padding under the great toe to relieve pressure. Do daily dressing change with antibiotic ointment and place pad under the great toe during the day. Return in one week for close monitoring.

## 2017-05-15 ENCOUNTER — Ambulatory Visit: Payer: Medicare HMO | Admitting: Podiatry

## 2017-05-15 ENCOUNTER — Encounter: Payer: Self-pay | Admitting: Podiatry

## 2017-05-15 DIAGNOSIS — L97522 Non-pressure chronic ulcer of other part of left foot with fat layer exposed: Secondary | ICD-10-CM

## 2017-05-15 DIAGNOSIS — M2042 Other hammer toe(s) (acquired), left foot: Secondary | ICD-10-CM | POA: Diagnosis not present

## 2017-05-15 DIAGNOSIS — M2040 Other hammer toe(s) (acquired), unspecified foot: Secondary | ICD-10-CM

## 2017-05-15 DIAGNOSIS — E0842 Diabetes mellitus due to underlying condition with diabetic polyneuropathy: Secondary | ICD-10-CM

## 2017-05-15 NOTE — Progress Notes (Signed)
Subjective: 72 y.o. year old male patient presents for follow up on left great toe ulcer.  Objective: Dermatologic: Open skin ulcer at distal end no change since last visit. Base is red and dry. Vascular: Pedal pulses are not palpable. Orthopedic: Contracted lesser digits 1-5 bilateral. Cavovarus foot with severe cocked up hallux. Neurologic: Loss of protective sensory perception bilateral.  Assessment: Ulcer left great toe, 1 cm involving subdermal layer. Severe digital contracture. IDDM with peripheral neuropathy.  Treatment: Wound debrided. Buttress pad placed in Padded post op shoe for off load. Home care instruction dispensed. Return in 2 week.

## 2017-05-15 NOTE — Patient Instructions (Signed)
Dressing changed and applied Amerigel ointment with Buttress padding. Post op shoe with added padding dispensed to off load left great toe. Return in 2 weeks.

## 2017-05-16 ENCOUNTER — Telehealth: Payer: Self-pay | Admitting: *Deleted

## 2017-05-16 NOTE — Telephone Encounter (Signed)
Is the amerigel to be applied once daily? [facility] Requests clarification. I will call them back the [phone])

## 2017-05-19 NOTE — Telephone Encounter (Signed)
Yes daily

## 2017-05-19 NOTE — Telephone Encounter (Signed)
Called [facility] back with clarification

## 2017-05-28 ENCOUNTER — Ambulatory Visit: Payer: Medicare HMO | Admitting: Podiatry

## 2017-05-28 ENCOUNTER — Encounter: Payer: Self-pay | Admitting: Podiatry

## 2017-05-28 DIAGNOSIS — M2042 Other hammer toe(s) (acquired), left foot: Secondary | ICD-10-CM

## 2017-05-28 DIAGNOSIS — M2040 Other hammer toe(s) (acquired), unspecified foot: Secondary | ICD-10-CM

## 2017-05-28 DIAGNOSIS — L97522 Non-pressure chronic ulcer of other part of left foot with fat layer exposed: Secondary | ICD-10-CM | POA: Diagnosis not present

## 2017-05-28 DIAGNOSIS — E0842 Diabetes mellitus due to underlying condition with diabetic polyneuropathy: Secondary | ICD-10-CM

## 2017-05-28 NOTE — Progress Notes (Signed)
Subjective: 72 y.o. year old male patient presents for 2 week follow up on left great toe ulcer. He is from [facility] and accompanied by a care taker.  Objective: Dermatologic: Open skin ulcer at distal end surrounded with hard keratotic tissue. Size decreased to 0.5 cm in center base. Dry base without drainage. Vascular: Pedal pulses are not palpable. Orthopedic: Contracted lesser digits 1-5 bilateral. Cavovarus foot with cocked up hallux bilateral. Severe EHL contracture with plantar flexed great toe left foot. Neurologic: Loss of protective sensory perception bilateral.  Radiographic examination reveal  Subluxed IPJ of the hallux with plantar sagging mid foot. Distal end of the phalanx is in contact with weight bearing surface.  Assessment: Improving left great toe ulcer, 0.5 cm diameter involving subdermal layer. Severe digital contracture bilateral. EHL contracture left great toe. IDDM with peripheral neuropathy.  Treatment: Wound debrided and removed devitalized callused tissue. Buttress pad placed with spare supply to replace at home. Home care instruction given to continue with daily Amerigel ointment dressing. Return in 2 weeks.

## 2017-05-28 NOTE — Patient Instructions (Signed)
Seen for follow up on left great toe ulcer. Noted of improvement. Continue with Amerigel ointment daily dressing. Return in 2 weeks.

## 2017-05-29 ENCOUNTER — Ambulatory Visit: Payer: Medicare HMO | Admitting: Podiatry

## 2017-06-11 ENCOUNTER — Ambulatory Visit: Payer: Medicare HMO | Admitting: Podiatry

## 2017-06-11 ENCOUNTER — Encounter: Payer: Self-pay | Admitting: Podiatry

## 2017-06-11 DIAGNOSIS — M2040 Other hammer toe(s) (acquired), unspecified foot: Secondary | ICD-10-CM | POA: Diagnosis not present

## 2017-06-11 DIAGNOSIS — L97522 Non-pressure chronic ulcer of other part of left foot with fat layer exposed: Secondary | ICD-10-CM

## 2017-06-11 DIAGNOSIS — E0842 Diabetes mellitus due to underlying condition with diabetic polyneuropathy: Secondary | ICD-10-CM | POA: Diagnosis not present

## 2017-06-11 NOTE — Patient Instructions (Signed)
Seen for follow up on left great toe ulcer. Good wound healing in progress. Keep the Buttress pad under the big toe during the day. Cover the wound with antibiotic ointment dressing during the day. Return in 3 weeks.

## 2017-06-11 NOTE — Progress Notes (Signed)
Subjective: 72 y.o. year old male patient presents for 2 weeks follow up on left great toe ulcer. He is in [facility].  Objective: Dermatologic: Dystrophic left great toe nail Open ulcer distal end left great toe covered with hard keratotic tissue. Base is about 0.3 cm and mostly dry. No active drainage or erythema noted. Vascular: Pedal pulses are not palpable. Orthopedic: Contracted lesser digits 1-5 bilateral. Cavovarus foot with cocked up hallux and digital lesion left great toe. Positive of EHL contracture severe left great toe. Neurologic: Loss of protective sensory perception bilateral.  Assessment: Open ulcer with improvement, wound size decreased to 0.3 cm in base without drainage. Severe digital contracture with EHL contracture left great toe. IDDM with peripheral neuropathy.  Treatment: Wound debrided and removed all devitalized and keratinized tissue. Buttress pad placed with Antibiotic ointment dressing. Continue daily wound care as prescribed. Return in 3 weeks.

## 2017-07-02 ENCOUNTER — Ambulatory Visit (INDEPENDENT_AMBULATORY_CARE_PROVIDER_SITE_OTHER): Payer: Medicare HMO | Admitting: Podiatry

## 2017-07-02 DIAGNOSIS — I739 Peripheral vascular disease, unspecified: Secondary | ICD-10-CM

## 2017-07-02 DIAGNOSIS — L97522 Non-pressure chronic ulcer of other part of left foot with fat layer exposed: Secondary | ICD-10-CM | POA: Diagnosis not present

## 2017-07-02 DIAGNOSIS — E0842 Diabetes mellitus due to underlying condition with diabetic polyneuropathy: Secondary | ICD-10-CM | POA: Diagnosis not present

## 2017-07-02 DIAGNOSIS — M2040 Other hammer toe(s) (acquired), unspecified foot: Secondary | ICD-10-CM | POA: Diagnosis not present

## 2017-07-02 NOTE — Progress Notes (Signed)
Subjective: 72 y.o. year old male patient presents for 3 weeks follow up on left great toe ulcer. He is in [facility] assisted living. Patient denies any discomfort.  Objective: Dermatologic: Closed off lesion with dry base at distal end left great toe. Noted of minimum callus. Vascular: Pedal pulses are not palpable. No trophic changes noted. Orthopedic: Contracted lesser digits 1-5 bilateral. Cavovarus foot with cocked up hallux and digital lesion left great toe. Positive of EHL contracture severe left great toe. Neurologic: Loss of protective sensory perception bilateral.  Assessment: Wound healing complete left great toe distal end. Severe digital contracture with EHL contracture left great toe. IDDM with peripheral neuropathy. R/O PVD  Treatment: Reviewed findings and discussed possible surgical option, IPJ fusion of the left great toe pending Vascular clearance. Referral placed to Vascular consult as pre op screening for possible IPJ fusion of the left great toe to prevent recurring ulcer. Patient understands the discussion and agreed with the plan.

## 2017-07-02 NOTE — Patient Instructions (Signed)
Left great toe wound healing complete. Reviewed available options to prevent further breakdown of the lesion, surgical option. Will schedule IPJ fusion procedure pending Vascular clearance. Will place referral.

## 2017-07-03 ENCOUNTER — Encounter: Payer: Self-pay | Admitting: Podiatry

## 2017-08-04 ENCOUNTER — Other Ambulatory Visit: Payer: Self-pay

## 2017-08-04 DIAGNOSIS — I739 Peripheral vascular disease, unspecified: Secondary | ICD-10-CM

## 2017-08-05 ENCOUNTER — Ambulatory Visit: Payer: Medicare HMO | Admitting: Podiatry

## 2017-08-05 ENCOUNTER — Encounter: Payer: Self-pay | Admitting: Podiatry

## 2017-08-05 DIAGNOSIS — E0842 Diabetes mellitus due to underlying condition with diabetic polyneuropathy: Secondary | ICD-10-CM | POA: Diagnosis not present

## 2017-08-05 DIAGNOSIS — I739 Peripheral vascular disease, unspecified: Secondary | ICD-10-CM | POA: Diagnosis not present

## 2017-08-05 DIAGNOSIS — L97522 Non-pressure chronic ulcer of other part of left foot with fat layer exposed: Secondary | ICD-10-CM | POA: Diagnosis not present

## 2017-08-05 NOTE — Progress Notes (Signed)
Subjective: 72 year old male presents follow up on left great toe ulcer. Due to recurring condition it was discussed possible surgical intervention pending Vascular clearance. Patient has Vascular specialist visit scheduled for 08/18/17.   Objective: Dermatologic:Clean callused distal end left great toe without opening. Vascular:Pedal pulses are not palpable.  Orthopedic:Contracted lesser digits 1-5 bilateral. Cavovarus foot with cocked up hallux and digital lesion left great toe. Positive of EHL contracture severe left great toe. Neurologic:Loss of protective sensory perception bilateral.  Assessment: Healed ulcer covered with callus at distal end left great toe. Severe digital contracture with EHL contracture left great toe. IDDM with peripheral neuropathy. R/O PVD  Plan: Affected are debrided and Vitamin A cream applied with Band aid. Patient is to continue keep moisturizing cream over the old ulcer site. Will schedule toe surgery pending Vascular clearance.

## 2017-08-05 NOTE — Patient Instructions (Signed)
Callused pre ulcerative lesion left great toe debrided and covered with Moisturizing cream. Will schedule surgery once Vascular test is done and satisfied. Keep the area of left great toe covered with moisturizing cream and band aid.

## 2017-08-18 ENCOUNTER — Other Ambulatory Visit: Payer: Self-pay

## 2017-08-18 ENCOUNTER — Ambulatory Visit: Payer: Medicare HMO | Admitting: Surgery

## 2017-08-18 ENCOUNTER — Encounter: Payer: Self-pay | Admitting: Surgery

## 2017-08-18 ENCOUNTER — Ambulatory Visit (HOSPITAL_COMMUNITY)
Admission: RE | Admit: 2017-08-18 | Discharge: 2017-08-18 | Disposition: A | Discharge status: Home / Self Care | Payer: Medicare HMO | Source: Ambulatory Visit | Attending: Surgery | Admitting: Surgery

## 2017-08-18 VITALS — BP 128/70 | HR 62 | Temp 97.0°F | Resp 20 | Ht 66.0 in | Wt 185.0 lb

## 2017-08-18 DIAGNOSIS — R9389 Abnormal findings on diagnostic imaging of other specified body structures: Secondary | ICD-10-CM | POA: Diagnosis not present

## 2017-08-18 DIAGNOSIS — I739 Peripheral vascular disease, unspecified: Secondary | ICD-10-CM | POA: Diagnosis not present

## 2017-08-18 DIAGNOSIS — R0989 Other specified symptoms and signs involving the circulatory and respiratory systems: Secondary | ICD-10-CM | POA: Diagnosis present

## 2017-08-18 NOTE — Progress Notes (Signed)
[facility] [place]  Patient name: Douglas Flores MRN: [id number] DOB: June 26, 1945 Sex: male   REQUESTING PROVIDER:    Dr. Raynald Kemp   REASON FOR CONSULT:    Left foot ulcer  HISTORY OF PRESENT ILLNESS:   Douglas Flores is a 72 y.o. male, who is referred for evaluation of blood flow to his left foot.  The patient reports having had a ulcer on his left great toe for several months however miraculously, this has healed.  He denies any symptoms of claudication.  He denies any rest pain at night.  He has no lower extremity symptoms.  Patient suffers from diabetes.  He has a history of coronary artery disease and is status post CABG.  He is medically managed for hypertension.  He takes a statin for hypercholesterolemia.  He is a non-smoker.  PAST MEDICAL HISTORY    Past Medical History:  Diagnosis Date  . Asthma   . CHF (congestive heart failure) (HCC)   . Collagen vascular disease (HCC)   . Coronary artery disease   . Dementia   . Diabetes mellitus without complication (HCC)   . Hypertension   . PTSD (post-traumatic stress disorder)   . Syncope   . Vertigo      FAMILY HISTORY   Positive for premature cardiovascular disease in his sister and brother  SOCIAL HISTORY:   Social History   Socioeconomic History  . Marital status: Single    Spouse name: Not on file  . Number of children: Not on file  . Years of education: Not on file  . Highest education level: Not on file  Occupational History  . Not on file  Social Needs  . Financial resource strain: Not on file  . Food insecurity:    Worry: Not on file    Inability: Not on file  . Transportation needs:    Medical: Not on file    Non-medical: Not on file  Tobacco Use  . Smoking status: Never Smoker  . Smokeless tobacco: Never Used  Substance and Sexual Activity  . Alcohol use: Yes    Comment: occ  . Drug use: Not on file  . Sexual activity: Not on file    Lifestyle  . Physical activity:    Days per week: Not on file    Minutes per session: Not on file  . Stress: Not on file  Relationships  . Social connections:    Talks on phone: Not on file    Gets together: Not on file    Attends religious service: Not on file    Active member of club or organization: Not on file    Attends meetings of clubs or organizations: Not on file    Relationship status: Not on file  . Intimate partner violence:    Fear of current or ex partner: Not on file    Emotionally abused: Not on file    Physically abused: Not on file    Forced sexual activity: Not on file  Other Topics Concern  . Not on file  Social History Narrative  . Not on file    ALLERGIES:    No Known Allergies  CURRENT MEDICATIONS:    Current Outpatient Medications  Medication Sig Dispense Refill  . albuterol (PROVENTIL HFA;VENTOLIN HFA) 108 (90 BASE) MCG/ACT inhaler Inhale 1 puff into the lungs every 4 (four) hours as needed for wheezing or shortness of breath.     . ALPRAZolam (XANAX) 1 MG tablet Take 1  mg by mouth 2 (two) times daily.    Marland Kitchen atorvastatin (LIPITOR) 80 MG tablet Take 40 mg by mouth daily at 6 PM.    . azithromycin (ZITHROMAX) 250 MG tablet Take 1 tablet (250 mg total) by mouth daily. Take first 2 tablets together, then 1 every day until finished. 6 tablet 0  . doxylamine, Sleep, (UNISOM) 25 MG tablet Take 25 mg by mouth at bedtime as needed for sleep.    . finasteride (PROSCAR) 5 MG tablet Take 5 mg by mouth daily as needed (frequent urination).     Marland Kitchen gentamicin cream (GARAMYCIN) 0.1 % Apply 1 application topically at bedtime as needed (toe).     . insulin NPH-regular Human (NOVOLIN 70/30) (70-30) 100 UNIT/ML injection Inject 20 Units into the skin 2 (two) times daily.    Marland Kitchen ketorolac (TORADOL) 10 MG tablet Take 10 mg by mouth every 6 (six) hours as needed for moderate pain or severe pain.   0  . metFORMIN (GLUCOPHAGE) 1000 MG tablet Take 1,000 mg by mouth 2 (two)  times daily.    . metoprolol (LOPRESSOR) 50 MG tablet Take 1 tablet (50 mg total) by mouth 3 (three) times daily.    . nitroGLYCERIN (NITROSTAT) 0.4 MG SL tablet Place 0.4 mg under the tongue as needed for chest pain.     . nortriptyline (PAMELOR) 50 MG capsule Take 50 mg by mouth at bedtime.    . predniSONE (DELTASONE) 20 MG tablet Take 1 tablet (20 mg total) by mouth daily. 5 tablet 0  . ranitidine (ZANTAC) 300 MG tablet Take 300 mg by mouth at bedtime.    Marland Kitchen terazosin (HYTRIN) 2 MG capsule Take 2 mg by mouth at bedtime.    . traZODone (DESYREL) 100 MG tablet Take 100 mg by mouth at bedtime.    Marland Kitchen albuterol (PROVENTIL) (2.5 MG/3ML) 0.083% nebulizer solution Take 3 mLs (2.5 mg total) by nebulization every 4 (four) hours for 7 days. 126 mL 0   No current facility-administered medications for this visit.     REVIEW OF SYSTEMS:   [X]  denotes positive finding, [ ]  denotes negative finding Cardiac  Comments:  Chest pain or chest pressure:    Shortness of breath upon exertion:    Short of breath when lying flat:    Irregular heart rhythm:        Vascular    Pain in calf, thigh, or hip brought on by ambulation:    Pain in feet at night that wakes you up from your sleep:     Blood clot in your veins:    Leg swelling:         Pulmonary    Oxygen at home:    Productive cough:     Wheezing:         Neurologic    Sudden weakness in arms or legs:     Sudden numbness in arms or legs:     Sudden onset of difficulty speaking or slurred speech:    Temporary loss of vision in one eye:     Problems with dizziness:         Gastrointestinal    Blood in stool:      Vomited blood:         Genitourinary    Burning when urinating:     Blood in urine:        Psychiatric    Major depression:         Hematologic    Bleeding  problems:    Problems with blood clotting too easily:        Skin    Rashes or ulcers:        Constitutional    Fever or chills:     PHYSICAL EXAM:   Vitals:    08/18/17 1537  BP: 128/70  Pulse: 62  Resp: 20  Temp: (!) 97 F (36.1 C)  TempSrc: Oral  SpO2: 93%  Weight: 185 lb (83.9 kg)  Height: 5\' 6"  (1.676 m)    GENERAL: The patient is a well-nourished male, in no acute distress. The vital signs are documented above. CARDIAC: There is a regular rate and rhythm.  VASCULAR: No carotid bruits.  Palpable femoral pulses.  Pedal pulses are not palpable PULMONARY: Nonlabored respirations ABDOMEN: Soft and non-tender with normal pitched bowel sounds.  MUSCULOSKELETAL: There are no major deformities or cyanosis. NEUROLOGIC: No focal weakness or paresthesias are detected. SKIN: There are no ulcers or rashes noted. PSYCHIATRIC: The patient has a normal affect.  STUDIES:   I have ordered and reviewed his vascular lab studies.  ABI on the right is 0.73.  ABI on the left is 0.56.  Toe pressure on the right is 45.  It is absent on the left  ASSESSMENT and PLAN   Atherosclerotic lower extremity vascular disease: Somehow, the patient was able to heal a wound on his left great toe.  His toe pressure was 0 on evaluation today.  He does not have any symptoms of claudication or rest pain.  He has no open wounds.  Therefore, no further evaluation is needed at this time.  I stressed the importance of optimization of his medical problems to minimize the risk of developing another wound.  Should he develop another wound, I stressed to him that he needs to have me evaluate his blood flow via angiography as soon as possible to minimize the likelihood of limb loss.  I have him scheduled for follow-up in 1 year but he will contact me sooner if he develops any issues.   Durene CalWells Larenda Reedy, MD [facility] Tel [phone] Pager [phone]

## 2019-01-12 ENCOUNTER — Emergency Department (HOSPITAL_BASED_OUTPATIENT_CLINIC_OR_DEPARTMENT_OTHER): Payer: Medicare HMO

## 2019-01-12 ENCOUNTER — Emergency Department (HOSPITAL_BASED_OUTPATIENT_CLINIC_OR_DEPARTMENT_OTHER)
Admission: EM | Admit: 2019-01-12 | Discharge: 2019-01-13 | Disposition: A | Discharge status: Skilled Nursing Facility | Payer: Medicare HMO | Attending: Emergency Medicine | Admitting: Emergency Medicine

## 2019-01-12 ENCOUNTER — Encounter (HOSPITAL_BASED_OUTPATIENT_CLINIC_OR_DEPARTMENT_OTHER): Payer: Self-pay | Admitting: Emergency Medicine

## 2019-01-12 ENCOUNTER — Other Ambulatory Visit: Payer: Self-pay

## 2019-01-12 DIAGNOSIS — J45909 Unspecified asthma, uncomplicated: Secondary | ICD-10-CM | POA: Insufficient documentation

## 2019-01-12 DIAGNOSIS — I110 Hypertensive heart disease with heart failure: Secondary | ICD-10-CM | POA: Diagnosis not present

## 2019-01-12 DIAGNOSIS — M25511 Pain in right shoulder: Secondary | ICD-10-CM | POA: Insufficient documentation

## 2019-01-12 DIAGNOSIS — I509 Heart failure, unspecified: Secondary | ICD-10-CM | POA: Diagnosis not present

## 2019-01-12 DIAGNOSIS — G8929 Other chronic pain: Secondary | ICD-10-CM | POA: Insufficient documentation

## 2019-01-12 DIAGNOSIS — F0390 Unspecified dementia without behavioral disturbance: Secondary | ICD-10-CM | POA: Diagnosis not present

## 2019-01-12 DIAGNOSIS — E119 Type 2 diabetes mellitus without complications: Secondary | ICD-10-CM | POA: Insufficient documentation

## 2019-01-12 MED ORDER — OXYCODONE HCL 5 MG PO TABS
5.0000 mg | ORAL_TABLET | Freq: Once | ORAL | Status: AC
  Administered 2019-01-12: 5 mg via ORAL
  Filled 2019-01-12: qty 1

## 2019-01-12 NOTE — ED Provider Notes (Signed)
[facility] Emergency Department Provider Note MRN:  [id number]  Arrival date & time: 01/12/19     Chief Complaint   Shoulder Pain (right)   History of Present Illness   Douglas Flores is a 73 y.o. year-old male with a history of diabetes, dementia presenting to the ED with chief complaint of shoulder pain.  Patient states pain has been present in the right shoulder for 2 years.  Has a appointment with an orthopedic surgeon this week.  Here because of worsening pain.  Pain is only present with motion.  Pain is moderate to severe.  Pain feels similar to when his other shoulder was having similar problems and needed surgery.  He denies chest pain or shortness of breath, no fever, no cough, no abdominal pain, no numbness or weakness to the arms or legs.  Review of Systems  A complete 10 system review of systems was obtained and all systems are negative except as noted in the HPI and PMH.   Patient's Health History    Past Medical History:  Diagnosis Date  . Asthma   . CHF (congestive heart failure) ([place])   . Collagen vascular disease ([place])   . Coronary artery disease   . Dementia ([place])   . Diabetes mellitus without complication ([place])   . Hypertension   . PTSD (post-traumatic stress disorder)   . Syncope   . Vertigo     Past Surgical History:  Procedure Laterality Date  . CARDIAC SURGERY    . EXPLORATION POST OPERATIVE OPEN HEART  2005  . TOTAL SHOULDER REPLACEMENT Left     History reviewed. No pertinent family history.  Social History   Socioeconomic History  . Marital status: Single    Spouse name: Not on file  . Number of children: Not on file  . Years of education: Not on file  . Highest education level: Not on file  Occupational History  . Not on file  Social Needs  . Financial resource strain: Not on file  . Food insecurity    Worry: Not on file    Inability: Not on file  . Transportation needs    Medical: Not on file   Non-medical: Not on file  Tobacco Use  . Smoking status: Never Smoker  . Smokeless tobacco: Never Used  Substance and Sexual Activity  . Alcohol use: Yes    Comment: occ  . Drug use: Not Currently  . Sexual activity: Not on file  Lifestyle  . Physical activity    Days per week: Not on file    Minutes per session: Not on file  . Stress: Not on file  Relationships  . Social Herbalist on phone: Not on file    Gets together: Not on file    Attends religious service: Not on file    Active member of club or organization: Not on file    Attends meetings of clubs or organizations: Not on file    Relationship status: Not on file  . Intimate partner violence    Fear of current or ex partner: Not on file    Emotionally abused: Not on file    Physically abused: Not on file    Forced sexual activity: Not on file  Other Topics Concern  . Not on file  Social History Narrative  . Not on file     Physical Exam  Vital Signs and Nursing Notes reviewed Vitals:   01/12/19 2002  BP: Marland Kitchen)  154/76  Pulse: 88  Resp: 18  Temp: 98 F (36.7 C)  SpO2: 97%    CONSTITUTIONAL: Well-appearing, NAD NEURO:  Alert and oriented x 3, no focal deficits EYES:  eyes equal and reactive ENT/NECK:  no LAD, no JVD CARDIO: Regular rate, well-perfused, normal S1 and S2 PULM:  CTAB no wheezing or rhonchi GI/GU:  normal bowel sounds, non-distended, non-tender MSK/SPINE:  No gross deformities, no edema, preserved range of motion of the bilateral shoulders, mild tenderness palpation to the anterior shoulder on the right SKIN:  no rash, atraumatic PSYCH:  Appropriate speech and behavior  Diagnostic and Interventional Summary    EKG Interpretation  Date/Time:    Ventricular Rate:    PR Interval:    QRS Duration:   QT Interval:    QTC Calculation:   R Axis:     Text Interpretation:        Labs Reviewed - No data to display  DG Shoulder Right  Final Result      Medications  oxyCODONE  (Oxy IR/ROXICODONE) immediate release tablet 5 mg (has no administration in time range)     Procedures  /  Critical Care Procedures  ED Course and Medical Decision Making  I have reviewed the triage vital signs and the nursing notes.  Pertinent labs & imaging results that were available during my care of the patient were reviewed by me and considered in my medical decision making (see below for details).     Chronic shoulder pain, normal vital signs, no erythema, preserved range of motion, nothing to suggest septic joint, x-ray normal, appropriate for discharge.  Discussed patient's condition with patient's son, who agrees with discharge.  Patient is scheduled orthopedic follow-up.  Elmer Sow. Pilar Plate, MD [facility] Emergency Medicine [facility] [email or web address]  Final Clinical Impressions(s) / ED Diagnoses     ICD-10-CM   1. Chronic right shoulder pain  M25.511    G89.29     ED Discharge Orders    None       Discharge Instructions Discussed with and Provided to Patient:     Discharge Instructions     You were evaluated in the Emergency Department and after careful evaluation, we did not find any emergent condition requiring admission or further testing in the hospital.  Your exam/testing today is overall reassuring.  Your x-ray is normal.  Please follow-up with your orthopedic specialists.  Please return to the Emergency Department if you experience any worsening of your condition.  We encourage you to follow up with a primary care provider.  Thank you for allowing [place] to be a part of your care.      Sabas Sous, MD 01/12/19 2116

## 2019-01-12 NOTE — Discharge Instructions (Addendum)
You were evaluated in the Emergency Department and after careful evaluation, we did not find any emergent condition requiring admission or further testing in the hospital.  Your exam/testing today is overall reassuring.  Your x-ray is normal.  Please follow-up with your orthopedic specialists.  Please return to the Emergency Department if you experience any worsening of your condition.  We encourage you to follow up with a primary care provider.  Thank you for allowing [place] to be a part of your care.

## 2019-01-12 NOTE — ED Triage Notes (Signed)
Patient arrived via [facility] from [facility] c/o increased pain in right shoulder similar to when he had left shoulder replaced. Per EMS patient has limited range of motion with 9/10 pain. Patient is AO x 4, Elevated BP, normal gait.

## 2020-07-16 ENCOUNTER — Emergency Department (HOSPITAL_COMMUNITY)
Admission: EM | Admit: 2020-07-16 | Discharge: 2020-07-16 | Disposition: A | Discharge status: Home / Self Care | Payer: Medicare HMO | Attending: Emergency Medicine | Admitting: Emergency Medicine

## 2020-07-16 ENCOUNTER — Other Ambulatory Visit: Payer: Self-pay

## 2020-07-16 ENCOUNTER — Emergency Department (HOSPITAL_COMMUNITY): Payer: Medicare HMO

## 2020-07-16 ENCOUNTER — Encounter (HOSPITAL_COMMUNITY): Payer: Self-pay

## 2020-07-16 DIAGNOSIS — J45909 Unspecified asthma, uncomplicated: Secondary | ICD-10-CM | POA: Insufficient documentation

## 2020-07-16 DIAGNOSIS — R1084 Generalized abdominal pain: Secondary | ICD-10-CM | POA: Diagnosis not present

## 2020-07-16 DIAGNOSIS — Z7984 Long term (current) use of oral hypoglycemic drugs: Secondary | ICD-10-CM | POA: Diagnosis not present

## 2020-07-16 DIAGNOSIS — Z79899 Other long term (current) drug therapy: Secondary | ICD-10-CM | POA: Insufficient documentation

## 2020-07-16 DIAGNOSIS — Z794 Long term (current) use of insulin: Secondary | ICD-10-CM | POA: Insufficient documentation

## 2020-07-16 DIAGNOSIS — I110 Hypertensive heart disease with heart failure: Secondary | ICD-10-CM | POA: Insufficient documentation

## 2020-07-16 DIAGNOSIS — I2510 Atherosclerotic heart disease of native coronary artery without angina pectoris: Secondary | ICD-10-CM | POA: Diagnosis not present

## 2020-07-16 DIAGNOSIS — E119 Type 2 diabetes mellitus without complications: Secondary | ICD-10-CM | POA: Diagnosis not present

## 2020-07-16 DIAGNOSIS — K5900 Constipation, unspecified: Secondary | ICD-10-CM

## 2020-07-16 DIAGNOSIS — I509 Heart failure, unspecified: Secondary | ICD-10-CM | POA: Insufficient documentation

## 2020-07-16 DIAGNOSIS — F0390 Unspecified dementia without behavioral disturbance: Secondary | ICD-10-CM | POA: Diagnosis not present

## 2020-07-16 LAB — CBC WITH DIFFERENTIAL/PLATELET
Abs Immature Granulocytes: 0.02 10*3/uL (ref 0.00–0.07)
Basophils Absolute: 0 10*3/uL (ref 0.0–0.1)
Basophils Relative: 0 %
Eosinophils Absolute: 0.1 10*3/uL (ref 0.0–0.5)
Eosinophils Relative: 1 %
HCT: 37.4 % — ABNORMAL LOW (ref 39.0–52.0)
Hemoglobin: 12.8 g/dL — ABNORMAL LOW (ref 13.0–17.0)
Immature Granulocytes: 0 %
Lymphocytes Relative: 28 %
Lymphs Abs: 2.2 10*3/uL (ref 0.7–4.0)
MCH: 33.4 pg (ref 26.0–34.0)
MCHC: 34.2 g/dL (ref 30.0–36.0)
MCV: 97.7 fL (ref 80.0–100.0)
Monocytes Absolute: 0.6 10*3/uL (ref 0.1–1.0)
Monocytes Relative: 7 %
Neutro Abs: 5.2 10*3/uL (ref 1.7–7.7)
Neutrophils Relative %: 64 %
Platelets: 229 10*3/uL (ref 150–400)
RBC: 3.83 MIL/uL — ABNORMAL LOW (ref 4.22–5.81)
RDW: 12.2 % (ref 11.5–15.5)
WBC: 8.1 10*3/uL (ref 4.0–10.5)
nRBC: 0 % (ref 0.0–0.2)

## 2020-07-16 LAB — COMPREHENSIVE METABOLIC PANEL
ALT: 16 U/L (ref 0–44)
AST: 14 U/L — ABNORMAL LOW (ref 15–41)
Albumin: 3.9 g/dL (ref 3.5–5.0)
Alkaline Phosphatase: 40 U/L (ref 38–126)
Anion gap: 6 (ref 5–15)
BUN: 13 mg/dL (ref 8–23)
CO2: 28 mmol/L (ref 22–32)
Calcium: 9 mg/dL (ref 8.9–10.3)
Chloride: 104 mmol/L (ref 98–111)
Creatinine, Ser: 0.77 mg/dL (ref 0.61–1.24)
GFR, Estimated: >60 mL/min (ref >60)
Glucose, Bld: 220 mg/dL — ABNORMAL HIGH (ref 70–99)
Potassium: 4.4 mmol/L (ref 3.5–5.1)
Sodium: 138 mmol/L (ref 135–145)
Total Bilirubin: 0.6 mg/dL (ref 0.3–1.2)
Total Protein: 6.8 g/dL (ref 6.5–8.1)

## 2020-07-16 LAB — URINALYSIS, ROUTINE W REFLEX MICROSCOPIC
Bacteria, UA: NONE SEEN
Bilirubin Urine: NEGATIVE
Glucose, UA: >=500 mg/dL — AB
Hgb urine dipstick: NEGATIVE
Ketones, ur: NEGATIVE mg/dL
Nitrite: NEGATIVE
Protein, ur: NEGATIVE mg/dL
Specific Gravity, Urine: 1.012 (ref 1.005–1.030)
pH: 6 (ref 5.0–8.0)

## 2020-07-16 LAB — LIPASE, BLOOD: Lipase: 25 U/L (ref 11–51)

## 2020-07-16 MED ORDER — DOCUSATE SODIUM 250 MG PO CAPS: 250.0000 mg | ORAL_CAPSULE | Freq: Every day | ORAL | 0 refills | Status: DC

## 2020-07-16 NOTE — ED Triage Notes (Addendum)
Pt BIB EMS from [place] on [address]. Pt reports left sided abdominal pain and urinary incontinence x2-3 months. Pt has hx of dementia.  CBG 242 138/70 94% RA HR 70

## 2020-07-16 NOTE — ED Notes (Signed)
Pt transported to CT ?

## 2020-07-16 NOTE — ED Notes (Signed)
[facility] called for transport back to facility 
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
## 2020-07-16 NOTE — ED Notes (Signed)
Pt found ambulating naked around in room. Pt placed back in bed and given sandwich and water per request. Bed alarm activated.

## 2020-07-17 LAB — URINE CULTURE

## 2021-12-30 ENCOUNTER — Emergency Department (HOSPITAL_BASED_OUTPATIENT_CLINIC_OR_DEPARTMENT_OTHER)
Admission: EM | Admit: 2021-12-30 | Discharge: 2021-12-30 | Disposition: A | Discharge status: Home / Self Care | Payer: Medicare HMO | Attending: Emergency Medicine | Admitting: Emergency Medicine

## 2021-12-30 ENCOUNTER — Other Ambulatory Visit: Payer: Self-pay

## 2021-12-30 ENCOUNTER — Emergency Department (HOSPITAL_BASED_OUTPATIENT_CLINIC_OR_DEPARTMENT_OTHER): Payer: Medicare HMO

## 2021-12-30 ENCOUNTER — Encounter (HOSPITAL_BASED_OUTPATIENT_CLINIC_OR_DEPARTMENT_OTHER): Payer: Self-pay | Admitting: Emergency Medicine

## 2021-12-30 DIAGNOSIS — Z7984 Long term (current) use of oral hypoglycemic drugs: Secondary | ICD-10-CM | POA: Insufficient documentation

## 2021-12-30 DIAGNOSIS — S0990XA Unspecified injury of head, initial encounter: Secondary | ICD-10-CM

## 2021-12-30 DIAGNOSIS — Y92129 Unspecified place in nursing home as the place of occurrence of the external cause: Secondary | ICD-10-CM | POA: Insufficient documentation

## 2021-12-30 DIAGNOSIS — S0003XA Contusion of scalp, initial encounter: Secondary | ICD-10-CM | POA: Insufficient documentation

## 2021-12-30 DIAGNOSIS — I110 Hypertensive heart disease with heart failure: Secondary | ICD-10-CM | POA: Insufficient documentation

## 2021-12-30 DIAGNOSIS — W182XXA Fall in (into) shower or empty bathtub, initial encounter: Secondary | ICD-10-CM | POA: Diagnosis not present

## 2021-12-30 DIAGNOSIS — Z794 Long term (current) use of insulin: Secondary | ICD-10-CM | POA: Diagnosis not present

## 2021-12-30 DIAGNOSIS — E119 Type 2 diabetes mellitus without complications: Secondary | ICD-10-CM | POA: Insufficient documentation

## 2021-12-30 DIAGNOSIS — J45909 Unspecified asthma, uncomplicated: Secondary | ICD-10-CM | POA: Diagnosis not present

## 2021-12-30 DIAGNOSIS — I2510 Atherosclerotic heart disease of native coronary artery without angina pectoris: Secondary | ICD-10-CM | POA: Diagnosis not present

## 2021-12-30 DIAGNOSIS — I509 Heart failure, unspecified: Secondary | ICD-10-CM | POA: Diagnosis not present

## 2021-12-30 NOTE — ED Triage Notes (Signed)
Brought by ems from [facility]  Patient reports he fell getting out of the shower and hit the back of his head on the floor.

## 2021-12-30 NOTE — ED Provider Notes (Signed)
[facility] Provider Note: Douglas Dell, MD, FACEP  CSN: [id number] MRN: [id number] ARRIVAL: 12/30/21 at 0604 ROOM: MH02/MH02   CHIEF COMPLAINT  Fall   HISTORY OF PRESENT ILLNESS  12/30/21 6:14 AM Douglas Flores is a 76 y.o. male nursing home resident.  He fell in the shower this morning hitting the back of his head on the tile floor.  He did not lose consciousness.  He recalls the fall.  He has not been vomiting.  He is not on anticoagulation.  He has a hematoma to his occiput.  He denies neck pain or other injury.   Past Medical History:  Diagnosis Date   Asthma    CHF (congestive heart failure) (HCC)    Collagen vascular disease (HCC)    Coronary artery disease    Dementia (HCC)    Diabetes mellitus without complication ([facility])    Hypertension    PTSD (post-traumatic stress disorder)    Syncope    Vertigo     Past Surgical History:  Procedure Laterality Date   CARDIAC SURGERY     EXPLORATION POST OPERATIVE OPEN HEART  2005   TOTAL SHOULDER REPLACEMENT Left     No family history on file.  Social History   Tobacco Use   Smoking status: Never   Smokeless tobacco: Never  Vaping Use   Vaping Use: Never used  Substance Use Topics   Alcohol use: Yes    Comment: occ   Drug use: Not Currently    Prior to Admission medications   Medication Sig Start Date End Date Taking? Authorizing Provider  atorvastatin (LIPITOR) 80 MG tablet Take 40 mg by mouth daily at 6 PM.    [provider]  busPIRone (BUSPAR) 10 MG tablet Take 10 mg by mouth 3 (three) times daily.    [provider]  docusate sodium (COLACE) 250 MG capsule Take 1 capsule (250 mg total) by mouth daily. 07/16/20   Lorre Nick, MD  finasteride (PROSCAR) 5 MG tablet Take 5 mg by mouth daily as needed (frequent urination).     [provider]  HYDROcodone-acetaminophen (NORCO/VICODIN) 5-325 MG tablet Take 1 tablet by mouth every 6 (six) hours as needed for moderate pain.     [provider]  insulin aspart (NOVOLOG) 100 UNIT/ML injection Inject into the skin 3 (three) times daily before meals.    [provider]  insulin glargine (LANTUS) 100 UNIT/ML injection Inject 30 Units into the skin daily.    [provider]  Menthol, Topical Analgesic, (BIOFREEZE) 4 % GEL Apply topically.    [provider]  metFORMIN (GLUCOPHAGE) 1000 MG tablet Take 1,000 mg by mouth 2 (two) times daily.    [provider]  metoprolol (LOPRESSOR) 50 MG tablet Take 1 tablet (50 mg total) by mouth 3 (three) times daily. 07/26/14   Leatha Gilding, MD  omeprazole (PRILOSEC) 20 MG capsule Take 20 mg by mouth daily.    [provider]  polyethylene glycol (MIRALAX / GLYCOLAX) 17 g packet Take 17 g by mouth daily.    [provider]  risperiDONE (RISPERDAL) 0.5 MG tablet Take 0.5 mg by mouth 2 (two) times daily.    [provider]  traZODone (DESYREL) 100 MG tablet Take 100 mg by mouth at bedtime.    [provider]  trolamine salicylate (ASPERCREME) 10 % cream Apply 1 application topically as needed for muscle pain.    [provider]    Allergies Patient has  no known allergies.   REVIEW OF SYSTEMS  Negative except as noted here or in the History of Present Illness.   PHYSICAL EXAMINATION  Initial Vital Signs Blood pressure (!) 193/76, pulse 70, temperature 98 F (36.7 C), temperature source Oral, resp. rate 18, height 5\' 6"  (1.676 m), weight 86.2 kg, SpO2 93 %.  Examination General: Well-developed, well-nourished male in no acute distress; appearance consistent with age of record HENT: normocephalic; hematoma to occiput Eyes: pupils equal, round and reactive to light; extraocular muscles intact Neck: supple; nontender Heart: regular rate and rhythm Lungs: clear to auscultation bilaterally Abdomen: soft; nondistended; nontender; bowel sounds present Extremities: No deformity; full range of  motion; pulses normal Neurologic: Awake, alert; motor function intact in all extremities and symmetric; no facial droop Skin: Warm and dry Psychiatric: Normal mood and affect   RESULTS  Summary of this visit's results, reviewed and interpreted by myself:   EKG Interpretation  Date/Time:    Ventricular Rate:    PR Interval:    QRS Duration:   QT Interval:    QTC Calculation:   R Axis:     Text Interpretation:         Laboratory Studies: No results found for this or any previous visit (from the past 24 hour(s)). Imaging Studies: CT Head Wo Contrast  Result Date: 12/30/2021 CLINICAL DATA:  76 year old male status post fall getting out of shower. Struck head. EXAM: CT HEAD WITHOUT CONTRAST TECHNIQUE: Contiguous axial images were obtained from the base of the skull through the vertex without intravenous contrast. RADIATION DOSE REDUCTION: This exam was performed according to the departmental dose-optimization program which includes automated exposure control, adjustment of the mA and/or kV according to patient size and/or use of iterative reconstruction technique. COMPARISON:  Head CT 01/22/2020. FINDINGS: Brain: Stable cerebral volume. No midline shift, ventriculomegaly, mass effect, evidence of mass lesion, intracranial hemorrhage or evidence of cortically based acute infarction. Patchy chronic periventricular white matter hypodensity and mild heterogeneity in the left basal ganglia is stable since 2021. Vascular: Extensive Calcified atherosclerosis at the skull base. No suspicious intracranial vascular hyperdensity. Skull: Motion artifact at the left TMJ. No acute osseous abnormality identified. Sinuses/Orbits: Visualized paranasal sinuses and mastoids are stable and well aerated. Other: Calcified scalp vessel atherosclerosis. No acute orbit or scalp soft tissue injury identified. IMPRESSION: 1. No acute intracranial abnormality or acute traumatic injury identified. 2. Chronic cerebral  volume loss and white matter disease. 3. Advanced calcified atherosclerosis, including scalp vessel atherosclerosis which can be seen with End stage renal disease. Electronically Signed   By: Genevie Ann M.D.   On: 12/30/2021 06:44    ED COURSE and MDM  Nursing notes, initial and subsequent vitals signs, including pulse oximetry, reviewed and interpreted by myself.  Vitals:   12/30/21 0605 12/30/21 0608  BP: (!) 193/76   Pulse: 70   Resp: 18   Temp: 98 F (36.7 C)   TempSrc: Oral   SpO2: 93%   Weight:  86.2 kg  Height:  5\' 6"  (1.676 m)   Medications - No data to display  No evidence of significant cranial or intracranial injury.  I suspect this was a mechanical fall as the patient does remember the incident and it did occur in a shower.  The patient does have some baseline dementia but is fairly high functioning.  I feel he is safe to return to his nursing facility.  PROCEDURES  Procedures   ED DIAGNOSES     ICD-10-CM  1. Fall in shower  W18.2XXA     2. Minor head injury, initial encounter  S09.90XA     3. Scalp hematoma, initial encounter  S00.Marchia Bond, MD 12/30/21 [phone]

## 2023-05-23 ENCOUNTER — Other Ambulatory Visit: Payer: Self-pay

## 2023-05-23 DIAGNOSIS — I10 Essential (primary) hypertension: Secondary | ICD-10-CM | POA: Insufficient documentation

## 2023-05-23 DIAGNOSIS — R55 Syncope and collapse: Secondary | ICD-10-CM | POA: Insufficient documentation

## 2023-05-23 DIAGNOSIS — E119 Type 2 diabetes mellitus without complications: Secondary | ICD-10-CM | POA: Insufficient documentation

## 2023-05-23 DIAGNOSIS — I509 Heart failure, unspecified: Secondary | ICD-10-CM | POA: Insufficient documentation

## 2023-05-23 DIAGNOSIS — R42 Dizziness and giddiness: Secondary | ICD-10-CM | POA: Insufficient documentation

## 2023-05-23 DIAGNOSIS — F0390 Unspecified dementia without behavioral disturbance: Secondary | ICD-10-CM | POA: Insufficient documentation

## 2023-05-23 DIAGNOSIS — I2510 Atherosclerotic heart disease of native coronary artery without angina pectoris: Secondary | ICD-10-CM | POA: Insufficient documentation

## 2023-05-23 DIAGNOSIS — J45909 Unspecified asthma, uncomplicated: Secondary | ICD-10-CM | POA: Insufficient documentation

## 2023-05-23 DIAGNOSIS — G459 Transient cerebral ischemic attack, unspecified: Secondary | ICD-10-CM | POA: Insufficient documentation

## 2023-05-23 DIAGNOSIS — F4310 Post-traumatic stress disorder, unspecified: Secondary | ICD-10-CM | POA: Insufficient documentation

## 2023-05-23 DIAGNOSIS — M359 Systemic involvement of connective tissue, unspecified: Secondary | ICD-10-CM | POA: Insufficient documentation

## 2023-05-26 ENCOUNTER — Ambulatory Visit

## 2023-05-26 VITALS — BP 128/76 | HR 82 | Ht 66.0 in | Wt 159.0 lb

## 2023-05-26 DIAGNOSIS — R55 Syncope and collapse: Secondary | ICD-10-CM

## 2023-05-26 DIAGNOSIS — G459 Transient cerebral ischemic attack, unspecified: Secondary | ICD-10-CM

## 2023-05-26 DIAGNOSIS — I25810 Atherosclerosis of coronary artery bypass graft(s) without angina pectoris: Secondary | ICD-10-CM

## 2023-05-26 NOTE — Assessment & Plan Note (Signed)
 He has a history of CAD, CABG in 2005. Remains on antiplatelet therapy. Also remains on atorvastatin 80 mg once daily. No active cardiac symptoms. Functional status is limited due to deconditioning, poor balance and fall risk.  Will obtain transthoracic echocardiogram for interval assessment of cardiac structure and function.  Further modification of therapy based on test results.
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
## 2023-05-26 NOTE — Patient Instructions (Signed)
 Medication Instructions:  Your physician recommends that you continue on your current medications as directed. Please refer to the Current Medication list given to you today.  *If you need a refill on your cardiac medications before your next appointment, please call your pharmacy*   Lab Work: None ordered If you have labs (blood work) drawn today and your tests are completely normal, you will receive your results only by: MyChart Message (if you have MyChart) OR A paper copy in the mail If you have any lab test that is abnormal or we need to change your treatment, we will call you to review the results.   Testing/Procedures: Your physician has requested that you have an echocardiogram. Echocardiography is a painless test that uses sound waves to create images of your heart. It provides your doctor with information about the size and shape of your heart and how well your heart's chambers and valves are working. This procedure takes approximately one hour. There are no restrictions for this procedure. Please do NOT wear cologne, perfume, aftershave, or lotions (deodorant is allowed). Please arrive 15 minutes prior to your appointment time.  A zio monitor was ordered today. It will remain on for 14 days times 2. Remove 06/09/2023. You will then return monitor and event diary in provided box. It takes 1-2 weeks for report to be downloaded and returned to [place]. We will call you with the results. If monitor falls off or has orange flashing light, please call Zio for further instructions.    Follow-Up: At [facility] [place], you and your health needs are our priority.  As part of our continuing mission to provide you with exceptional heart care, we have created designated Provider Care Teams.  These Care Teams include your primary Cardiologist (physician) and Advanced Practice Providers (APPs -  Physician Assistants and Nurse Practitioners) who all work together to provide you with the care you need, when  you need it.  We recommend signing up for the patient portal called "MyChart".  Sign up information is provided on this After Visit Summary.  MyChart is used to connect with patients for Virtual Visits (Telemedicine).  Patients are able to view lab/test results, encounter notes, upcoming appointments, etc.  Non-urgent messages can be sent to your provider as well.   To learn more about what you can do with MyChart, go to [email or web address].    Your next appointment:   Follow up based on results  The format for your next appointment:   In Person  Provider:   Vern Claude Madireddy, MD   Other Instructions Echocardiogram An echocardiogram is a test that uses sound waves (ultrasound) to produce images of the heart. Images from an echocardiogram can provide important information about: Heart size and shape. The size and thickness and movement of your heart's walls. Heart muscle function and strength. Heart valve function or if you have stenosis. Stenosis is when the heart valves are too narrow. If blood is flowing backward through the heart valves (regurgitation). A tumor or infectious growth around the heart valves. Areas of heart muscle that are not working well because of poor blood flow or injury from a heart attack. Aneurysm detection. An aneurysm is a weak or damaged part of an artery wall. The wall bulges out from the normal force of blood pumping through the body. Tell a health care provider about: Any allergies you have. All medicines you are taking, including vitamins, herbs, eye drops, creams, and over-the-counter medicines. Any blood disorders you have. Any surgeries you  have had. Any medical conditions you have. Whether you are pregnant or may be pregnant. What are the risks? Generally, this is a safe test. However, problems may occur, including an allergic reaction to dye (contrast) that may be used during the test. What happens before the test? No specific preparation  is needed. You may eat and drink normally. What happens during the test? You will take off your clothes from the waist up and put on a hospital gown. Electrodes or electrocardiogram (ECG)patches may be placed on your chest. The electrodes or patches are then connected to a device that monitors your heart rate and rhythm. You will lie down on a table for an ultrasound exam. A gel will be applied to your chest to help sound waves pass through your skin. A handheld device, called a transducer, will be pressed against your chest and moved over your heart. The transducer produces sound waves that travel to your heart and bounce back (or "echo" back) to the transducer. These sound waves will be captured in real-time and changed into images of your heart that can be viewed on a video monitor. The images will be recorded on a computer and reviewed by your health care provider. You may be asked to change positions or hold your breath for a short time. This makes it easier to get different views or better views of your heart. In some cases, you may receive contrast through an IV in one of your veins. This can improve the quality of the pictures from your heart. The procedure may vary among health care providers and hospitals.   What can I expect after the test? You may return to your normal, everyday life, including diet, activities, and medicines, unless your health care provider tells you not to do that. Follow these instructions at home: It is up to you to get the results of your test. Ask your health care provider, or the department that is doing the test, when your results will be ready. Keep all follow-up visits. This is important. Summary An echocardiogram is a test that uses sound waves (ultrasound) to produce images of the heart. Images from an echocardiogram can provide important information about the size and shape of your heart, heart muscle function, heart valve function, and other possible heart  problems. You do not need to do anything to prepare before this test. You may eat and drink normally. After the echocardiogram is completed, you may return to your normal, everyday life, unless your health care provider tells you not to do that. This information is not intended to replace advice given to you by your health care provider. Make sure you discuss any questions you have with your health care provider. Document Revised: 10/12/2019 Document Reviewed: 10/12/2019 Elsevier Patient Education  2021 Elsevier Inc.   Important Information About Sugar

## 2023-05-26 NOTE — Assessment & Plan Note (Signed)
 His neurological symptoms improved and appears to be back to his baseline. Does not have any acute cardiac symptoms. From cardiac standpoint we will proceed with Zio patch monitor for 28 days to rule out any significant underlying A-fib burden.  Currently remains on dual antiplatelet therapy with plan to continue Plavix for total of 21 days. Remains on aspirin 81 mg once daily indefinitely. Also remains on atorvastatin 80 mg once daily.  Pending visits with vascular surgery and neurology as outpatient

## 2023-06-11 DIAGNOSIS — E785 Hyperlipidemia, unspecified: Secondary | ICD-10-CM | POA: Diagnosis not present

## 2023-06-11 DIAGNOSIS — I4892 Unspecified atrial flutter: Secondary | ICD-10-CM | POA: Diagnosis not present

## 2023-06-11 DIAGNOSIS — I10 Essential (primary) hypertension: Secondary | ICD-10-CM

## 2023-06-11 DIAGNOSIS — Z8673 Personal history of transient ischemic attack (TIA), and cerebral infarction without residual deficits: Secondary | ICD-10-CM

## 2023-06-11 DIAGNOSIS — I4510 Unspecified right bundle-branch block: Secondary | ICD-10-CM | POA: Diagnosis not present

## 2023-06-11 DIAGNOSIS — Z8679 Personal history of other diseases of the circulatory system: Secondary | ICD-10-CM | POA: Diagnosis not present

## 2023-06-11 DIAGNOSIS — I509 Heart failure, unspecified: Secondary | ICD-10-CM | POA: Diagnosis not present

## 2023-06-12 DIAGNOSIS — I10 Essential (primary) hypertension: Secondary | ICD-10-CM | POA: Diagnosis not present

## 2023-06-12 DIAGNOSIS — I4892 Unspecified atrial flutter: Secondary | ICD-10-CM | POA: Diagnosis not present

## 2023-06-12 DIAGNOSIS — I509 Heart failure, unspecified: Secondary | ICD-10-CM | POA: Diagnosis not present

## 2023-06-12 DIAGNOSIS — I340 Nonrheumatic mitral (valve) insufficiency: Secondary | ICD-10-CM | POA: Diagnosis not present

## 2023-06-12 DIAGNOSIS — I361 Nonrheumatic tricuspid (valve) insufficiency: Secondary | ICD-10-CM | POA: Diagnosis not present

## 2023-06-12 DIAGNOSIS — I445 Left posterior fascicular block: Secondary | ICD-10-CM | POA: Diagnosis not present

## 2023-06-12 DIAGNOSIS — I4510 Unspecified right bundle-branch block: Secondary | ICD-10-CM | POA: Diagnosis not present

## 2023-06-13 DIAGNOSIS — I4892 Unspecified atrial flutter: Secondary | ICD-10-CM | POA: Diagnosis not present

## 2023-06-13 DIAGNOSIS — I10 Essential (primary) hypertension: Secondary | ICD-10-CM | POA: Diagnosis not present

## 2023-06-17 ENCOUNTER — Encounter: Payer: Self-pay | Admitting: Internal Medicine

## 2023-06-19 DIAGNOSIS — I493 Ventricular premature depolarization: Secondary | ICD-10-CM | POA: Diagnosis not present

## 2023-06-19 DIAGNOSIS — I4510 Unspecified right bundle-branch block: Secondary | ICD-10-CM | POA: Diagnosis not present

## 2023-06-21 DIAGNOSIS — I2510 Atherosclerotic heart disease of native coronary artery without angina pectoris: Secondary | ICD-10-CM

## 2023-06-21 DIAGNOSIS — I4892 Unspecified atrial flutter: Secondary | ICD-10-CM | POA: Diagnosis not present

## 2023-06-21 DIAGNOSIS — I509 Heart failure, unspecified: Secondary | ICD-10-CM | POA: Diagnosis not present

## 2023-06-21 DIAGNOSIS — R001 Bradycardia, unspecified: Secondary | ICD-10-CM | POA: Diagnosis not present

## 2023-06-21 DIAGNOSIS — I429 Cardiomyopathy, unspecified: Secondary | ICD-10-CM | POA: Diagnosis not present

## 2023-06-22 ENCOUNTER — Telehealth: Payer: Self-pay | Admitting: Cardiology

## 2023-06-22 DIAGNOSIS — I4892 Unspecified atrial flutter: Secondary | ICD-10-CM | POA: Diagnosis not present

## 2023-06-22 DIAGNOSIS — I509 Heart failure, unspecified: Secondary | ICD-10-CM | POA: Diagnosis not present

## 2023-06-22 DIAGNOSIS — R001 Bradycardia, unspecified: Secondary | ICD-10-CM | POA: Diagnosis not present

## 2023-06-22 NOTE — Telephone Encounter (Signed)
 He is at [facility].

## 2023-06-22 NOTE — Telephone Encounter (Signed)
 Outpatient service line: I rhythm, VT 30 seconds  Received page by I rhythm reporting that on April 2 patient had an episode of VT lasting 30 seconds with a heart rate of 230.  Patient did not answer the phone.  I called his son Arnetta Lank who reports that he saw the vascular surgeon a couple weeks ago and had elevated heart rates and immediately had sent him to the emergency room.  He is still in the hospital currently, son reports that he is in the ICU getting what sounds like antiarrhythmic medications.

## 2023-06-23 DIAGNOSIS — E785 Hyperlipidemia, unspecified: Secondary | ICD-10-CM | POA: Diagnosis not present

## 2023-06-23 DIAGNOSIS — I4892 Unspecified atrial flutter: Secondary | ICD-10-CM | POA: Diagnosis not present

## 2023-06-23 DIAGNOSIS — I2510 Atherosclerotic heart disease of native coronary artery without angina pectoris: Secondary | ICD-10-CM

## 2023-06-23 DIAGNOSIS — I5042 Chronic combined systolic (congestive) and diastolic (congestive) heart failure: Secondary | ICD-10-CM | POA: Diagnosis not present

## 2023-06-23 DIAGNOSIS — I10 Essential (primary) hypertension: Secondary | ICD-10-CM | POA: Diagnosis not present

## 2023-06-24 ENCOUNTER — Encounter (HOSPITAL_COMMUNITY): Payer: Self-pay

## 2023-06-24 ENCOUNTER — Inpatient Hospital Stay: Admit: 2023-06-24 | Admitting: Family Medicine

## 2023-06-24 DIAGNOSIS — I2510 Atherosclerotic heart disease of native coronary artery without angina pectoris: Secondary | ICD-10-CM | POA: Diagnosis not present

## 2023-06-24 DIAGNOSIS — I5042 Chronic combined systolic (congestive) and diastolic (congestive) heart failure: Secondary | ICD-10-CM | POA: Diagnosis not present

## 2023-06-24 DIAGNOSIS — I4892 Unspecified atrial flutter: Secondary | ICD-10-CM | POA: Diagnosis not present

## 2023-07-02 ENCOUNTER — Ambulatory Visit

## 2023-07-22 ENCOUNTER — Ambulatory Visit: Admitting: Neurology

## 2023-07-22 ENCOUNTER — Encounter: Payer: Self-pay | Admitting: Neurology

## 2023-08-03 DEATH — deceased

## 2023-08-29 ENCOUNTER — Other Ambulatory Visit
